# Patient Record
Sex: Female | Born: 1940
Health system: Southern US, Community
[De-identification: ages and names within clinical notes are randomized; demographics above are authoritative.]

## PROBLEM LIST (undated history)

## (undated) DIAGNOSIS — K5730 Diverticulosis of large intestine without perforation or abscess without bleeding: Secondary | ICD-10-CM

## (undated) DIAGNOSIS — Z8719 Personal history of other diseases of the digestive system: Secondary | ICD-10-CM

## (undated) DIAGNOSIS — A419 Sepsis, unspecified organism: Secondary | ICD-10-CM

## (undated) DIAGNOSIS — Z8619 Personal history of other infectious and parasitic diseases: Secondary | ICD-10-CM

## (undated) DIAGNOSIS — E039 Hypothyroidism, unspecified: Secondary | ICD-10-CM

## (undated) DIAGNOSIS — Z87440 Personal history of urinary (tract) infections: Secondary | ICD-10-CM

## (undated) DIAGNOSIS — K219 Gastro-esophageal reflux disease without esophagitis: Secondary | ICD-10-CM

## (undated) DIAGNOSIS — N200 Calculus of kidney: Secondary | ICD-10-CM

## (undated) DIAGNOSIS — E7800 Pure hypercholesterolemia, unspecified: Secondary | ICD-10-CM

## (undated) DIAGNOSIS — K5909 Other constipation: Secondary | ICD-10-CM

## (undated) DIAGNOSIS — Z973 Presence of spectacles and contact lenses: Secondary | ICD-10-CM

## (undated) DIAGNOSIS — K449 Diaphragmatic hernia without obstruction or gangrene: Secondary | ICD-10-CM

## (undated) DIAGNOSIS — E785 Hyperlipidemia, unspecified: Secondary | ICD-10-CM

## (undated) DIAGNOSIS — K5790 Diverticulosis of intestine, part unspecified, without perforation or abscess without bleeding: Secondary | ICD-10-CM

## (undated) DIAGNOSIS — N3020 Other chronic cystitis without hematuria: Secondary | ICD-10-CM

## (undated) DIAGNOSIS — Z972 Presence of dental prosthetic device (complete) (partial): Secondary | ICD-10-CM

## (undated) DIAGNOSIS — Z87442 Personal history of urinary calculi: Secondary | ICD-10-CM

## (undated) HISTORY — PX: BUNIONECTOMY: SHX129

## (undated) HISTORY — PX: ABDOMINAL HYSTERECTOMY: SHX81

## (undated) HISTORY — PX: THYROIDECTOMY: SHX17

---

## 1998-10-05 ENCOUNTER — Other Ambulatory Visit: Admission: RE | Admit: 1998-10-05 | Discharge: 1998-10-05 | Payer: Self-pay | Admitting: *Deleted

## 1998-12-29 ENCOUNTER — Ambulatory Visit (HOSPITAL_BASED_OUTPATIENT_CLINIC_OR_DEPARTMENT_OTHER): Admission: RE | Admit: 1998-12-29 | Discharge: 1998-12-29 | Payer: Self-pay | Admitting: Surgery

## 1999-10-11 ENCOUNTER — Other Ambulatory Visit: Admission: RE | Admit: 1999-10-11 | Discharge: 1999-10-11 | Payer: Self-pay | Admitting: *Deleted

## 1999-12-03 ENCOUNTER — Encounter (INDEPENDENT_AMBULATORY_CARE_PROVIDER_SITE_OTHER): Payer: Self-pay | Admitting: Specialist

## 1999-12-03 ENCOUNTER — Encounter: Payer: Self-pay | Admitting: Surgery

## 1999-12-03 ENCOUNTER — Ambulatory Visit (HOSPITAL_BASED_OUTPATIENT_CLINIC_OR_DEPARTMENT_OTHER): Admission: RE | Admit: 1999-12-03 | Discharge: 1999-12-03 | Payer: Self-pay | Admitting: Surgery

## 2000-04-14 ENCOUNTER — Encounter (INDEPENDENT_AMBULATORY_CARE_PROVIDER_SITE_OTHER): Payer: Self-pay | Admitting: *Deleted

## 2000-04-14 ENCOUNTER — Ambulatory Visit (HOSPITAL_COMMUNITY): Admission: RE | Admit: 2000-04-14 | Discharge: 2000-04-14 | Payer: Self-pay | Admitting: Gastroenterology

## 2000-11-14 ENCOUNTER — Other Ambulatory Visit: Admission: RE | Admit: 2000-11-14 | Discharge: 2000-11-14 | Payer: Self-pay | Admitting: *Deleted

## 2002-09-21 ENCOUNTER — Ambulatory Visit (HOSPITAL_BASED_OUTPATIENT_CLINIC_OR_DEPARTMENT_OTHER): Admission: RE | Admit: 2002-09-21 | Discharge: 2002-09-21 | Payer: Self-pay | Admitting: Urology

## 2002-09-27 ENCOUNTER — Emergency Department (HOSPITAL_COMMUNITY): Admission: EM | Admit: 2002-09-27 | Discharge: 2002-09-27 | Payer: Self-pay | Admitting: Emergency Medicine

## 2003-03-03 ENCOUNTER — Other Ambulatory Visit: Admission: RE | Admit: 2003-03-03 | Discharge: 2003-03-03 | Payer: Self-pay | Admitting: *Deleted

## 2003-08-02 ENCOUNTER — Ambulatory Visit (HOSPITAL_COMMUNITY): Admission: RE | Admit: 2003-08-02 | Discharge: 2003-08-02 | Payer: Self-pay | Admitting: Internal Medicine

## 2003-08-02 ENCOUNTER — Encounter (INDEPENDENT_AMBULATORY_CARE_PROVIDER_SITE_OTHER): Payer: Self-pay | Admitting: Specialist

## 2003-12-16 ENCOUNTER — Encounter (INDEPENDENT_AMBULATORY_CARE_PROVIDER_SITE_OTHER): Payer: Self-pay | Admitting: Specialist

## 2003-12-16 ENCOUNTER — Ambulatory Visit (HOSPITAL_COMMUNITY): Admission: RE | Admit: 2003-12-16 | Discharge: 2003-12-16 | Payer: Self-pay | Admitting: Gastroenterology

## 2010-06-14 ENCOUNTER — Ambulatory Visit (HOSPITAL_BASED_OUTPATIENT_CLINIC_OR_DEPARTMENT_OTHER): Admission: RE | Admit: 2010-06-14 | Discharge: 2010-06-14 | Payer: Self-pay | Admitting: Surgery

## 2010-07-30 ENCOUNTER — Ambulatory Visit (HOSPITAL_COMMUNITY)
Admission: RE | Admit: 2010-07-30 | Discharge: 2010-07-30 | Payer: Self-pay | Source: Home / Self Care | Attending: Urology | Admitting: Urology

## 2010-08-04 ENCOUNTER — Encounter: Payer: Self-pay | Admitting: Internal Medicine

## 2010-09-28 ENCOUNTER — Other Ambulatory Visit: Payer: Self-pay | Admitting: Gastroenterology

## 2010-09-28 DIAGNOSIS — R109 Unspecified abdominal pain: Secondary | ICD-10-CM

## 2010-10-04 ENCOUNTER — Ambulatory Visit
Admission: RE | Admit: 2010-10-04 | Discharge: 2010-10-04 | Disposition: A | Discharge status: Home / Self Care | Payer: Medicare Other | Source: Ambulatory Visit | Attending: Gastroenterology | Admitting: Gastroenterology

## 2010-10-04 DIAGNOSIS — R109 Unspecified abdominal pain: Secondary | ICD-10-CM

## 2010-10-04 MED ORDER — IOHEXOL 300 MG/ML  SOLN
125.0000 mL | Freq: Once | INTRAMUSCULAR | Status: AC | PRN
  Administered 2010-10-04: 125 mL via INTRAVENOUS

## 2010-10-11 ENCOUNTER — Other Ambulatory Visit: Payer: Self-pay | Admitting: Gastroenterology

## 2010-11-30 NOTE — Op Note (Signed)
[place], [facility], [facility]  Patient:    Joanna Johnston, Joanna Johnston                       MRN: [id number] Proc. Date: 12/03/99 Attending:  Sandria Bales. Ezzard Standing, M.D. Dictator:   Sandria Bales. Ezzard Standing, M.D. CC:         Jamesetta Geralds, M.D.                           Operative Report  DATE OF BIRTH:  11/03/1940  PREOPERATIVE DIAGNOSIS:  Microcalcification/mass with abnormal mammogram at 2 oclock position of left breast.  POSTOPERATIVE DIAGNOSIS:  Microcalcification/mass with abnormal mammogram at 2 oclock position of left breast.  PROCEDURE:  Needle excisional breast biopsy of left breast.  SURGEON:  Dr. Ezzard Standing.  ANESTHESIA:  MAC anesthesia with approximately 18 cc of 1% Xylocaine with epinephrine.  COMPLICATIONS:  None.  INDICATIONS FOR PROCEDURE:  Joanna Johnston is a 70 year old white female who comes with an abnormal mammogram showing some microcalcifications in the upper outer quadrant of the left breast with a questionable mass effect.  Changes are somewhat subtle, but appear to be real, and now comes for excisional breast biopsy.  DESCRIPTION OF PROCEDURE:  The patient is placed in a supine position.  Given a MAC anesthesia.  The left breast was prepped with Betadine solution and sterilely draped.  A wire was protruding from the left breast in the 2 oclock position about 4 cm from the edge of the areola, and was directed directly posterior.  I infiltrated the skin with 1% Xylocaine with epinephrine, then excised the wire with a surrounding block of breast tissue approximately 3 x 4 cm in size.  I went down to the lateral edge of the pectoralis major muscle, even may of taken a lymph node with the specimen.  Grossly, there was no abnormal mass within this specimen.  I sent it for specimen mammography which confirmed that the microcalcifications were excised along with a probable mass.  This was sent to pathology for a permanent analysis.  The wound was irrigated,  subcutaneous tissues closed with a 3-0 Vicryl suture. Skin closed with a 5-0 Vicryl suture.  Painted with ______, benzoin, and Steri-Strips, and sterilely dressed.  The patient tolerated the procedure well, was transported to the recovery room in good condition.  Sponge and needle count were correct at the end of the case. DD:  12/03/99 TD:  12/03/99 Job: [id number]

## 2010-11-30 NOTE — Op Note (Signed)
NAME:  Joanna Johnston, Joanna Johnston                        ACCOUNT NO.:  [id number]   MEDICAL RECORD NO.:  [id number]                   PATIENT TYPE:  AMB   LOCATION:  ENDO                                 FACILITY:  [facility]   PHYSICIAN:  Bernette Redbird, M.D.                DATE OF BIRTH:  20-Aug-1940   DATE OF PROCEDURE:  12/16/2003  DATE OF DISCHARGE:                                 OPERATIVE REPORT   PROCEDURE:  Colonoscopy with biopsy.   INDICATION:  Joanna Johnston is a 70 year old female with a family history of  colon cancer in her father at age 54, and a personal history of some polyps  having been removed in the past by another physician, cauterized to  destruction, so histologic character is unknown.  She underwent colonoscopy  by me about 4 years ago which was negative for any adenomas.   FINDINGS:  Solitary diminutive rectal polyp.   DESCRIPTION OF PROCEDURE:  The nature, purpose, and risks of the procedure  were familiar to the patient from prior examination.  She provided written  consent.  Sedation was fentanyl 50 mcg and Versed 8 mg IV without  arrhythmias or desaturation.  The Olympus adjustable tension pediatric video  colonoscope was easily advanced to the cecum which was identified by  visualization of the appendiceal orifice.  Pullback was then performed.  The  quality of the prep was excellent, and it is felt that all areas were well  seen.   There was some mild to moderate left-sided diverticulosis.  There was a 3 mm  sessile polyp in the rectum removed by a two cold biopsies.  No other polyps  were seen, and there was no evidence of cancer, colitis, or vascular  malformations.  Retroflexion in the rectum and reinspection in the rectum  was otherwise unremarkable.   The patient tolerated the procedure well, and there were no apparent  complications.   IMPRESSION:  1. Solitary diminutive rectal polyp removed as described above (211.4).  2. Left-sided diverticulosis.   PLAN:  Await pathology results with anticipated colonoscopic follow up in 5  years in view of the family history of colon cancer and the previous history  of some sort of polyp removed from this patient (indeterminate histology).                                               Bernette Redbird, M.D.    RB/MEDQ  D:  12/16/2003  T:  12/16/2003  Job:  [id number]   cc:   Geoffry Paradise, M.D.  [address]  [place]  [place] [address]  Fax: [phone]

## 2010-11-30 NOTE — Op Note (Signed)
NAME:  Joanna Johnston, PALO                        ACCOUNT NO.:  [id number]   MEDICAL RECORD NO.:  [id number]                   PATIENT TYPE:  AMB   LOCATION:  [facility]                                 FACILITY:  [facility]   PHYSICIAN:  Mark C. Vernie Ammons, M.D.               DATE OF BIRTH:  [birth year]   DATE OF PROCEDURE:  09/21/2002  DATE OF DISCHARGE:                                 OPERATIVE REPORT   PREOPERATIVE DIAGNOSES:  Right flank pain and hematuria.   POSTOPERATIVE DIAGNOSES:  Right flank pain and hematuria.   PROCEDURE:  Cystoscopy, right retrograde pyelogram with interpretation,  right ureteroscopy and double J stent placement.   SURGEON:  Mark C. Vernie Ammons, M.D.   ANESTHESIA:  General.   ESTIMATED BLOOD LOSS:  Less than 5 mL.   SPECIMENS:  None.   DRAINS:  4.7 French, 24 cm double J stent in the left ureter with string.   COMPLICATIONS:  None.   INDICATIONS FOR PROCEDURE:  The patient is a 70 year old white female who  was originally seen on February 25 with right sided flank pain, microscopic  hematuria and was found on KUB to have multiple phleboliths. She had classic  ureteral colic. She underwent renal ultrasound and KUB on September 15, 2002 that  showed no hydronephrosis or stones were seen. Multiple calcifications were  seen on KUB in the pelvis. She wanted to forgo contrast study and due to the  fact that this was likely a stone she was brought to the OR today for  retrograde and ureteroscopy. She understands the fact that a definite stone  has not been demonstrated at this time.   DESCRIPTION OF PROCEDURE:  After informed consent, the patient brought the  major OR, placed on the table administered general anesthesia then moved to  the dorsal lithotomy position. The genitalia were sterilely prepped and  draped. A 21 French cystoscope was introduced to the bladder, the bladder  was fully inspected and noted to be free of tumor, stones or inflammatory  lesions. The right  ureteral orifice was identified, it was noted to be very  small. I could not get a 6 [place] open ended ureteral catheter into the  orifice. I therefore chose an 8 French cone tip catheter which I was able to  get in the orifice and the action of the cone tip caused some dilation. I  was able to get the stent into the distal ureter and performed a right  retrograde pyelogram under direct fluoroscopic control. Revealed normal  appearing ureter with no filling defects throughout its course. It was a  little full and tapered right at the UPJ. I saw no filling defects,  hydronephrosis, masses or mass effect in the area of the kidney. The  collecting system otherwise appeared normal.   I removed the cone tip catheter and reinserted a 6 [place] open ended which I  was able to get into  the orifice and passed a 0.038 inch floppy tip  guidewire up the ureter under fluoroscopic control. Leaving the guidewire in  place and draining the bladder, I removed the cystoscope and ureteral  catheter. Attempt to pass the 6 French rigid ureteroscope next to the  guidewire was unsuccessful. I therefore back loaded the cystoscope over the  guidewire and passed the 14 French 4 cm length ureteral dilating balloon  over the guidewire and placed this through the UVJ region and began to  inflate. As I did this, I noted some waisting of the dilating balloon  indicating some narrowing, possible mild stricture. This dilated easily. I  then deflated the balloon and was then able to pass the ureteroscope next to  the guidewire and I was able to pass this all the way up the left ureter  without the finding of any stone, tumor or other abnormality. I was able to  pass the scope all the way to the renal pelvis with no abnormality being  noted. I visualized the ureter as I removed the scope and again found no  abnormality. I left the guidewire in place, back loaded the cystoscope and  passed the double J stent over the  guidewire into the pelvis with good curl  being noted in the renal pelvis and bladder. I then removed the guidewire  and the cystoscope after draining the bladder. The string was left affixed  to the stent and taped to the inner thigh and the B&O suppository inserted  per rectum. The patient was awakened and taken to the recovery room in  stable satisfactory condition. She tolerated the procedure well with no  intraoperative complications. She will be given a prescription for 36  Pyridium plus for bladder irritation. She already has Toradol and Mepergan  for pain.  Will followup in my office in approximately five days.                                               Mark C. Vernie Ammons, M.D.    MCO/MEDQ  D:  09/21/2002  T:  09/21/2002  Job:  [id number]

## 2010-11-30 NOTE — Op Note (Signed)
[place]. [facility]  Patient:    Joanna Johnston, Joanna Johnston                     MRN: [id number] Proc. Date: 04/14/00 Adm. Date:  [id number] Attending:  Rich Brave CC:         Jamesetta Geralds, M.D.   Operative Report  PROCEDURE:  Colonoscopy with polypectomy.  INDICATIONS:  Follow up of several small polyps of indeterminate histology removed several years ago by another gastroenterologist.  FINDINGS:  Two small polyps removed.  Scattered pancolonic diverticulosis.  DESCRIPTION OF PROCEDURE:  The nature, purpose, and risks of the procedure have been reviewed with the patient who provided written consent.  Sedation was fentanyl 100 mcg and Versed 6 mg IV without arrhythmias or desaturation. The Olympus pediatric adjustable tension video colonoscope, PCF160L was advanced quite easily around the colon to the cecum as identified by clear visualization of typical cecal anatomy.  This included the ileocecal valve. A careful inspection of the cecum was performed and pullback was then performed. The quality of the prep was excellent and it is felt that all areas were well seen.  There were two polyps identified during this examination.  The first was a sessile 3 x 4 mm hyperplastic-appearing polyp in the cecum, removed by two or three cold biopsies and the other was a semipedunculated polyp at the rectosigmoid junction at about 20 cm, removed by snare technique with complete hemostasis and no evidence of excessive cautery.  No large polyps, cancer, colitis, or vascular malformations were seen.  There was a cecal diverticulum as well as some scattered diverticulosis elsewhere in the colon, but the amount of diverticulosis was rather modest.  Retroflexion in the rectum showed some internal hemorrhoids, also seen during pullout through the anal canal.  The patient tolerated this procedure well and there were no apparent complications.  IMPRESSION: 1. Two small  polyps removed as described above. 2. Diverticulosis, mild.  PLAN:  Await pathology on the polyps with anticipated colonoscopic follow-up in three to five years. DD:  04/14/00 TD:  04/14/00 Job: [id number]

## 2011-04-01 ENCOUNTER — Inpatient Hospital Stay (HOSPITAL_COMMUNITY)
Admission: EM | Admit: 2011-04-01 | Discharge: 2011-04-07 | DRG: 872 | Disposition: A | Discharge status: Home / Self Care | Payer: Medicare Other | Source: Ambulatory Visit | Attending: Internal Medicine | Admitting: Internal Medicine

## 2011-04-01 ENCOUNTER — Emergency Department (HOSPITAL_COMMUNITY): Payer: Medicare Other

## 2011-04-01 DIAGNOSIS — A4151 Sepsis due to Escherichia coli [E. coli]: Principal | ICD-10-CM | POA: Diagnosis present

## 2011-04-01 DIAGNOSIS — I959 Hypotension, unspecified: Secondary | ICD-10-CM | POA: Diagnosis present

## 2011-04-01 DIAGNOSIS — N200 Calculus of kidney: Secondary | ICD-10-CM | POA: Diagnosis present

## 2011-04-01 DIAGNOSIS — Z7982 Long term (current) use of aspirin: Secondary | ICD-10-CM

## 2011-04-01 DIAGNOSIS — R319 Hematuria, unspecified: Secondary | ICD-10-CM | POA: Diagnosis present

## 2011-04-01 DIAGNOSIS — Z23 Encounter for immunization: Secondary | ICD-10-CM

## 2011-04-01 DIAGNOSIS — I10 Essential (primary) hypertension: Secondary | ICD-10-CM | POA: Diagnosis present

## 2011-04-01 DIAGNOSIS — E785 Hyperlipidemia, unspecified: Secondary | ICD-10-CM | POA: Diagnosis present

## 2011-04-01 DIAGNOSIS — Z79899 Other long term (current) drug therapy: Secondary | ICD-10-CM

## 2011-04-01 DIAGNOSIS — N390 Urinary tract infection, site not specified: Secondary | ICD-10-CM | POA: Diagnosis present

## 2011-04-01 DIAGNOSIS — E039 Hypothyroidism, unspecified: Secondary | ICD-10-CM | POA: Diagnosis present

## 2011-04-01 DIAGNOSIS — R112 Nausea with vomiting, unspecified: Secondary | ICD-10-CM | POA: Diagnosis present

## 2011-04-01 DIAGNOSIS — E538 Deficiency of other specified B group vitamins: Secondary | ICD-10-CM | POA: Diagnosis present

## 2011-04-01 DIAGNOSIS — A419 Sepsis, unspecified organism: Secondary | ICD-10-CM | POA: Diagnosis present

## 2011-04-01 DIAGNOSIS — I808 Phlebitis and thrombophlebitis of other sites: Secondary | ICD-10-CM | POA: Diagnosis not present

## 2011-04-01 LAB — CBC
HCT: 36 % (ref 36.0–46.0)
Hemoglobin: 12.4 g/dL (ref 12.0–15.0)
WBC: 13.5 10*3/uL — ABNORMAL HIGH (ref 4.0–10.5)

## 2011-04-01 LAB — DIFFERENTIAL
Basophils Absolute: 0 10*3/uL (ref 0.0–0.1)
Eosinophils Absolute: 0 10*3/uL (ref 0.0–0.7)
Eosinophils Relative: 0 % (ref 0–5)
Lymphocytes Relative: 5 % — ABNORMAL LOW (ref 12–46)
Lymphs Abs: 0.7 10*3/uL (ref 0.7–4.0)
Neutrophils Relative %: 87 % — ABNORMAL HIGH (ref 43–77)

## 2011-04-01 LAB — BASIC METABOLIC PANEL
BUN: 29 mg/dL — ABNORMAL HIGH (ref 6–23)
CO2: 27 mEq/L (ref 19–32)
Chloride: 105 mEq/L (ref 96–112)
Glucose, Bld: 136 mg/dL — ABNORMAL HIGH (ref 70–99)
Potassium: 3.1 mEq/L — ABNORMAL LOW (ref 3.5–5.1)

## 2011-04-01 LAB — URINALYSIS, ROUTINE W REFLEX MICROSCOPIC
Bilirubin Urine: NEGATIVE
Specific Gravity, Urine: 1.015 (ref 1.005–1.030)
pH: 5.5 (ref 5.0–8.0)

## 2011-04-01 LAB — URINE MICROSCOPIC-ADD ON

## 2011-04-02 ENCOUNTER — Observation Stay (HOSPITAL_COMMUNITY): Payer: Medicare Other

## 2011-04-02 LAB — CBC
HCT: 30.8 % — ABNORMAL LOW (ref 36.0–46.0)
MCH: 28.9 pg (ref 26.0–34.0)
MCHC: 34.1 g/dL (ref 30.0–36.0)
RDW: 13.7 % (ref 11.5–15.5)

## 2011-04-02 LAB — BASIC METABOLIC PANEL
BUN: 9 mg/dL (ref 6–23)
Calcium: 7.9 mg/dL — ABNORMAL LOW (ref 8.4–10.5)
Creatinine, Ser: 1.09 mg/dL (ref 0.50–1.10)
GFR calc Af Amer: >60 mL/min (ref >60)
GFR calc non Af Amer: 50 mL/min — ABNORMAL LOW (ref >60)
Potassium: 3.1 mEq/L — ABNORMAL LOW (ref 3.5–5.1)

## 2011-04-02 LAB — LACTIC ACID, PLASMA: Lactic Acid, Venous: 1 mmol/L (ref 0.5–2.2)

## 2011-04-02 LAB — B. BURGDORFI ANTIBODIES: B burgdorferi Ab IgG+IgM: 0.12 {ISR}

## 2011-04-02 LAB — MRSA PCR SCREENING: MRSA by PCR: NEGATIVE

## 2011-04-03 ENCOUNTER — Inpatient Hospital Stay (HOSPITAL_COMMUNITY): Payer: Medicare Other

## 2011-04-03 LAB — BASIC METABOLIC PANEL
BUN: 7 mg/dL (ref 6–23)
Calcium: 7.7 mg/dL — ABNORMAL LOW (ref 8.4–10.5)
Creatinine, Ser: 0.96 mg/dL (ref 0.50–1.10)
GFR calc Af Amer: >60 mL/min (ref >60)
GFR calc non Af Amer: 58 mL/min — ABNORMAL LOW (ref >60)

## 2011-04-03 LAB — CBC
HCT: 30.7 % — ABNORMAL LOW (ref 36.0–46.0)
MCH: 28 pg (ref 26.0–34.0)
MCHC: 32.9 g/dL (ref 30.0–36.0)
MCV: 85 fL (ref 78.0–100.0)
Platelets: 151 10*3/uL (ref 150–400)
RDW: 13.5 % (ref 11.5–15.5)
WBC: 9.4 10*3/uL (ref 4.0–10.5)

## 2011-04-04 LAB — COMPREHENSIVE METABOLIC PANEL
ALT: 26 U/L (ref 0–35)
Albumin: 2.7 g/dL — ABNORMAL LOW (ref 3.5–5.2)
Alkaline Phosphatase: 62 U/L (ref 39–117)
Calcium: 8.2 mg/dL — ABNORMAL LOW (ref 8.4–10.5)
GFR calc Af Amer: >60 mL/min (ref >60)
Glucose, Bld: 89 mg/dL (ref 70–99)
Potassium: 3.6 mEq/L (ref 3.5–5.1)
Sodium: 143 mEq/L (ref 135–145)
Total Protein: 5.9 g/dL — ABNORMAL LOW (ref 6.0–8.3)

## 2011-04-04 LAB — URINE CULTURE
Colony Count: >=100000
Culture  Setup Time: 201209171853

## 2011-04-05 ENCOUNTER — Inpatient Hospital Stay (HOSPITAL_COMMUNITY): Payer: Medicare Other

## 2011-04-06 LAB — COMPREHENSIVE METABOLIC PANEL
Albumin: 2.8 g/dL — ABNORMAL LOW (ref 3.5–5.2)
Alkaline Phosphatase: 76 U/L (ref 39–117)
BUN: 5 mg/dL — ABNORMAL LOW (ref 6–23)
Chloride: 106 mEq/L (ref 96–112)
Creatinine, Ser: 0.8 mg/dL (ref 0.50–1.10)
GFR calc Af Amer: >60 mL/min (ref >60)
GFR calc non Af Amer: >60 mL/min (ref >60)
Glucose, Bld: 109 mg/dL — ABNORMAL HIGH (ref 70–99)
Potassium: 3.4 mEq/L — ABNORMAL LOW (ref 3.5–5.1)
Total Bilirubin: 0.3 mg/dL (ref 0.3–1.2)

## 2011-04-06 LAB — CBC
HCT: 30.7 % — ABNORMAL LOW (ref 36.0–46.0)
Hemoglobin: 10.6 g/dL — ABNORMAL LOW (ref 12.0–15.0)
MCV: 83.2 fL (ref 78.0–100.0)
RDW: 12.8 % (ref 11.5–15.5)
WBC: 6.4 10*3/uL (ref 4.0–10.5)

## 2011-04-06 LAB — URINE CULTURE
Colony Count: NO GROWTH
Culture: NO GROWTH
Special Requests: NEGATIVE

## 2011-04-07 NOTE — H&P (Signed)
NAMEDENALY, GATLING NO.:  [id number]  MEDICAL RECORD NO.:  [id number]  LOCATION:                                 FACILITY:  PHYSICIAN:  Geoffry Paradise, M.D.  DATE OF BIRTH:  02-13-1941  DATE OF ADMISSION:  04/01/2011 DATE OF DISCHARGE:                             HISTORY & PHYSICAL   CHIEF COMPLAINT:  Fever and hypotension.  HISTORY OF PRESENT ILLNESS:  Joanna Johnston is a 70 year old well known to myself with hypothyroidism, hypertension, hyperlipidemia presenting at this time with hypotension and pyuria.  She relates that she developed the onset of some low-grade fevers and general malaise on Sunday. Noteworthy, she had been to the urologist sometime last week with some hematuria.  Sometime yesterday, she developed hard rigors and chills, followed by some nausea and vomiting and presented to the emergency room.  She has also developed some dysuria and some generalized upper back/flank pain.  She has had some dull headaches but no further vomiting or abdominal symptoms.  She has had no chest pain, cough, or congestion.  She has had no rash or joint symptoms.  In the emergency room, she had received 4 liters of normal saline and continued normal saline at 250 mL/hour with systolic pressures that were marginally in the upper 80s to 95 range.  I was called for admission and when instructed that an ICU admission was closed, Critical Care was consulted.  They did see the patient and felt as though since no lines or pressors were on board or "indicated," my only options were admitting her to step-down where if I was uncomfortable with that I could turn her over to the hospitalist.  I have come in to assess her myself and blood pressures have been running as documented on the monitor 73/45, 70/43, 81/38, 86/27, 88/45 with now 6 liters in and continued IV fluids at 250 mL/hour.  The patient is mentating and does feel better and has had a reasonable urine output.  She  does continue to have significant dysuria but no further abdominal symptoms, nausea, or vomiting.  She is admitted with suspect sepsis, urinary source for further management.  PAST MEDICAL HISTORY:  The patient is intolerant to CODEINE.  MEDICATIONS: 1. Vytorin 10/10 one daily. 2. Vitamin B12 - 1000 mcg daily. 3. Synthroid 75 mcg p.o. daily. 4. Calcium and vitamin D daily. 5. Avalide 150/12.5 daily. 6. Aspirin 81 mg daily.  MEDICAL ILLNESSES: 1. Essential hypertension. 2. Hyperlipidemia. 3. Hypothyroidism. 4. Known renal calculi.  FAMILY HISTORY:  Noncontributory.  SOCIAL HISTORY:  The patient is married with children, is currently working in a medical office administratively.  She does not smoke or drink.  PHYSICAL EXAMINATION:  VITAL SIGNS:  Temperature currently 98.2, T-max since arrival 102.1, blood pressure on arrival was 130/50, most recently systolics have ranged 70-88/43, heart rate 80-100, O2 saturation 97%. GENERAL:  The patient is awake, alert. HEENT:  Normocephalic, atraumatic individual.  Anicteric.  Oropharynx benign. SKIN:  No lesions.  Warm.  No mottling.  Oropharynx benign. NECK:  No JVD or bruits. LUNGS:  Clear to auscultation and percussion. CARDIOVASCULAR:  Regular rate and rhythm.  No murmur, gallop, rub, or heave. ABDOMEN:  Soft, nontender.  No hepatosplenomegaly. BACK:  Questionable right CVA tenderness.  No spinal tenderness. EXTREMITIES:  No mottling.  Warm.  Distal pulses intact.  Trace edema in hands and feet.  DATA:  Chest x-ray, no acute disease. CBC:  Hemoglobin 12.4, hematocrit 36, white blood count 13.5, platelet count 197,000.  Chemistry:  Sodium 140, potassium 3.1, chloride 105, CO2 is 27, glucose 136, BUN 29, creatinine 1.4, calcium 8.8.  Urinalysis remarkable for moderate leukocyte esterase, positive nitrite, 21-50 white cells, 7-10 red cells.  Lactic acid 1.  ASSESSMENT: 1. Sepsis, likely urinary source with prior urologic  history.  We will     obtain a stat renal ultrasound to rule out an obstructive     uropathy/hydro and/or abscess.  The patient has been pancultured on     Cipro and Rocephin.  Fluid resuscitation 6 liters and continues to     be hypotensive, we will start dopamine. 2. Essential hypertension. 3. Hyperlipidemia. 4. Hypothyroidism.  PLAN:  For further, see orders.  Please note, dictating attending, myself, felt as though ICU was appropriate but Critical Care Medicine has refused to allow this admission, so the patient will be admitted to step-down as I have no options, so turning this over to the hospitalist as was outlined to me.          ______________________________ Geoffry Paradise, M.D.     RA/MEDQ  D:  04/02/2011  T:  04/02/2011  Job:  [id number]  Electronically Signed by Geoffry Paradise M.D. on 04/07/2011 07:57:32 PM

## 2011-04-07 NOTE — Discharge Summary (Signed)
Joanna Johnston, Joanna Johnston NO.:  [id number]  MEDICAL RECORD NO.:  [id number]  LOCATION:  4505                         FACILITY:  [facility]  PHYSICIAN:  Gwen Pounds, MD       DATE OF BIRTH:  1941/04/19  DATE OF ADMISSION:  04/01/2011 DATE OF DISCHARGE:  04/07/2011                              DISCHARGE SUMMARY   PRIMARY CARE PROVIDER:  Geoffry Paradise, M.D.  PRIMARY UROLOGIST:  Mark C. Vernie Ammons, M.D.  DISCHARGE DIAGNOSES: 1. Escherichia coli urosepsis with severe hypotension. 2. Critically ill, requiring pressors to maintain blood pressures. 3. Known nephrolithiasis and hematuria, follows with Dr. Vernie Ammons in     Urology. 4. Hyperlipidemia. 5. B12 deficiency. 6. Hypothyroidism. 7. Hypertension. 8. Left antecubital superficial thrombophlebitis.  DISCHARGE PROCEDURES: 1. Status post ICU and pressor use. 2. Medicine management. 3. Renal ultrasound done on April 02, 2011, showing a 4-mm right     lower pole calculus, but no hydronephrosis. 4. KUB on April 03, 2011, showing nonspecific bowel gas pattern. 5. Chest x-ray on April 05, 2011, showing retrocardiac air space     opacification raising concern for pneumonia with associated small     left pleural effusion, mild right basilar atelectasis noted.  No     clinical pneumonia noted.  DISCHARGE MEDICATIONS: 1. Tylenol 650 mg every 4 hours as needed. 2. Ceftriaxone 1 g IM daily.  Already received 10:00 a.m. dose today,     will get the 10:00 a.m. dose tomorrow and then be transition to     Cipro for 500 b.i.d. for 7 further day course. 3. Tramadol 50 mg p.o. q.6 h. p.r.n. pain. 4. Cipro 500 mg p.o. b.i.d. for 7 days starting on April 09, 2011. 5. Aspirin 81 two tablets p.o. daily. 6. Calcium plus D 1 tablet p.o. daily. 7. Synthroid 75 mcg every day. 8. B12 1000 mcg as directed. 9. Vytorin 10/10 p.o. daily. 10.Avalide 150/12.5 on hold until Dr. Jacky Kindle restarts it or if blood     pressure  consistently goes over 140/85.  HISTORY OF PRESENT ILLNESS:  Briefly, Joanna Johnston is a 70 year old female with known nephrolithiasis and history of urinary related issues had her appointment with Dr. Vernie Ammons about a week and half or 2 weeks ago, had a negative culture.  She developed low-grade fevers and chills on the Sunday prior to admission.  She actually woke up the next day and was feeling better, went to work and got sicker with fevers, chills, rigors, nausea, vomiting, dysuria, back and flank pain, and dull headaches.  In the emergency room, she was noted to be hypotensive and sick.  She received 4 L of normal saline fluid with rapid fluid infusion and she was eventually admitted to the ICU.  Because a low blood pressures, pressors were started and were needed.  HOSPITAL COURSE:  Joanna Johnston was admitted through the Emergency Department with a white count of 13.5 and urinalysis compatible with urinary tract infection and creatinine went up to 1.4 and she was overtly ill and in urosepsis.  Workup included the renal ultrasound, which was stated above.  There was no obstructive uropathy, hydronephrosis, or abscess noted.  She  was pancultured and initially started on Cipro and Rocephin.  She continued to get IV fluids and was started on dopamine for pressors.  Lots of her outpatient medications were held.  Over the next several days, the antibiotics worked and she improved nicely, she started defervescing.  The urine culture came back for E-coli that was resistant to ampicillin and Bactrim, but sensitive to all other medications that were evaluated.  Blood cultures were negative.  Pressors were eventually weaned and when her blood pressures were appropriate, she was transferred to the floor.  Unfortunately during the hospital stay, she developed a left antecubital thrombophlebitis.  This was treated with DVT prophylaxis Lovenox and warm compresses and improved nicely,  but it still looks like it is going to take a few weeks to fully resolve.  Because of the superficial thrombophlebitis just mentioned, she had lots of issues.  She lost her IV.  The good news is she did not need IV fluids anymore, multiple attempts were obtained of getting a new IV and thoughts were about putting a PICC line in.  Eventually, we decided not to put the PICC line in and not to pursue further IV.  We have switched her over to IM Rocephin because to me it was felt that it would be just as effective, so she got a dose of IM Rocephin on Saturday April 06, 2011, and Sunday April 07, 2011.  Dr. Jacky Kindle had instructed that she was going to do a full days of IV Rocephin, therefore she will be discharged home today in stable condition and will follow up in our office tomorrow for her seventh of 7 days of Rocephin and then thereafter, she will finish up 7 more days of Cipro to finish out a 14- day course.  She did have temperatures up to 100.3 on April 05, 2011 and April 06, 2011 and 99.6 on April 06, 2011 and April 07, 2011.  She is markedly better at this current time.  She is feeling markedly better.  No nausea, no vomiting.  Eating, walking, and feeling much more like herself and on April 07, 2011, she is ready for discharge.  Today on rounds, any repeat cultures were all reviewed and are negative.  Chest x-ray was negative and is not felt to show pneumonia.  Again, she has had a Rocephin today and she is looking and feeling back to her baseline and will be discharged today.  Temperature 98.4 up to 99.5, heart rate 59-64, respiratory rate 20, blood pressure 118-131 over 62-64, and satting 94% on room air.  Her physical exam is at baseline.  Her right arm is bruised.  Her left arm shows left antecubital fossa which is superficial thrombophlebitis which seems about 3 or 4 cm in length, but markedly less swollen and certainly not hot, no cellulitis and no  issues to touch, certainly no pain.  There are no new labs reviewed today and her last CMET was yesterday and showed a sodium of 144, potassium 3.4, the rest of her labs were fine with a BUN of 5, creatinine 0.8, and glucose was 109 and liver tests were within normal limits except for a low albumin.  CBC yesterday showed a white count of 6.4, hemoglobin 10.6, and platelet count 200.  Therefore the current plan is, she will go home today, get 1 shot of Rocephin in the office, follow-up with Dr. Vernie Ammons on Wednesday, take Cipro for 1 week, and find out from Dr. Vernie Ammons what he wants to  do about that stone that has not moved.  Discussion had been already had regarding possible lithotripsy.  At this point in time, she can go back on her lipid medications.  She can continue on her Synthroid.  She no longer needs Lovenox or outpatient treatment of heparin.  She can continue warm compresses to the left arm and she will restart her blood pressure medicines when her blood pressure goes up or after following up with Dr. Jacky Kindle.     Gwen Pounds, MD     JMR/MEDQ  D:  04/07/2011  T:  04/07/2011  Job:  [id number]  cc:   Veverly Fells. Vernie Ammons, M.D. Geoffry Paradise, M.D.  Electronically Signed by Creola Corn MD on 04/07/2011 11:29:28 PM

## 2011-04-08 LAB — CULTURE, BLOOD (ROUTINE X 2)
Culture  Setup Time: 201209182215
Culture: NO GROWTH

## 2011-04-12 LAB — CULTURE, BLOOD (ROUTINE X 2): Culture  Setup Time: 201209220529

## 2011-05-29 ENCOUNTER — Other Ambulatory Visit: Payer: Self-pay | Admitting: Internal Medicine

## 2011-05-29 DIAGNOSIS — E039 Hypothyroidism, unspecified: Secondary | ICD-10-CM

## 2011-06-12 ENCOUNTER — Ambulatory Visit
Admission: RE | Admit: 2011-06-12 | Discharge: 2011-06-12 | Disposition: A | Discharge status: Home / Self Care | Payer: Medicare Other | Source: Ambulatory Visit | Attending: Internal Medicine | Admitting: Internal Medicine

## 2011-06-12 DIAGNOSIS — E039 Hypothyroidism, unspecified: Secondary | ICD-10-CM

## 2011-06-13 ENCOUNTER — Other Ambulatory Visit: Payer: Medicare Other

## 2011-06-20 ENCOUNTER — Other Ambulatory Visit: Payer: Self-pay | Admitting: Internal Medicine

## 2011-06-20 DIAGNOSIS — E041 Nontoxic single thyroid nodule: Secondary | ICD-10-CM

## 2011-06-25 ENCOUNTER — Ambulatory Visit
Admission: RE | Admit: 2011-06-25 | Discharge: 2011-06-25 | Disposition: A | Discharge status: Home / Self Care | Payer: Medicare Other | Source: Ambulatory Visit | Attending: Internal Medicine | Admitting: Internal Medicine

## 2011-06-25 ENCOUNTER — Other Ambulatory Visit (HOSPITAL_COMMUNITY)
Admission: RE | Admit: 2011-06-25 | Discharge: 2011-06-25 | Disposition: A | Discharge status: Home / Self Care | Payer: Medicare Other | Source: Ambulatory Visit | Attending: Interventional Radiology | Admitting: Interventional Radiology

## 2011-06-25 DIAGNOSIS — E041 Nontoxic single thyroid nodule: Secondary | ICD-10-CM

## 2011-06-25 DIAGNOSIS — E049 Nontoxic goiter, unspecified: Secondary | ICD-10-CM | POA: Insufficient documentation

## 2011-09-25 DIAGNOSIS — R3129 Other microscopic hematuria: Secondary | ICD-10-CM | POA: Diagnosis not present

## 2011-09-25 DIAGNOSIS — R82998 Other abnormal findings in urine: Secondary | ICD-10-CM | POA: Diagnosis not present

## 2011-10-08 DIAGNOSIS — R3129 Other microscopic hematuria: Secondary | ICD-10-CM | POA: Diagnosis not present

## 2011-12-03 DIAGNOSIS — N63 Unspecified lump in unspecified breast: Secondary | ICD-10-CM | POA: Diagnosis not present

## 2011-12-03 DIAGNOSIS — R350 Frequency of micturition: Secondary | ICD-10-CM | POA: Diagnosis not present

## 2011-12-19 DIAGNOSIS — E782 Mixed hyperlipidemia: Secondary | ICD-10-CM | POA: Diagnosis not present

## 2011-12-19 DIAGNOSIS — R82998 Other abnormal findings in urine: Secondary | ICD-10-CM | POA: Diagnosis not present

## 2011-12-19 DIAGNOSIS — R3129 Other microscopic hematuria: Secondary | ICD-10-CM | POA: Diagnosis not present

## 2011-12-19 DIAGNOSIS — I10 Essential (primary) hypertension: Secondary | ICD-10-CM | POA: Diagnosis not present

## 2011-12-19 DIAGNOSIS — R350 Frequency of micturition: Secondary | ICD-10-CM | POA: Diagnosis not present

## 2011-12-31 DIAGNOSIS — H9209 Otalgia, unspecified ear: Secondary | ICD-10-CM | POA: Diagnosis not present

## 2011-12-31 DIAGNOSIS — H60509 Unspecified acute noninfective otitis externa, unspecified ear: Secondary | ICD-10-CM | POA: Diagnosis not present

## 2011-12-31 DIAGNOSIS — H6120 Impacted cerumen, unspecified ear: Secondary | ICD-10-CM | POA: Diagnosis not present

## 2012-01-02 DIAGNOSIS — N3020 Other chronic cystitis without hematuria: Secondary | ICD-10-CM | POA: Diagnosis not present

## 2012-01-02 DIAGNOSIS — N200 Calculus of kidney: Secondary | ICD-10-CM | POA: Diagnosis not present

## 2012-01-13 DIAGNOSIS — N3020 Other chronic cystitis without hematuria: Secondary | ICD-10-CM | POA: Diagnosis not present

## 2012-02-04 DIAGNOSIS — N3020 Other chronic cystitis without hematuria: Secondary | ICD-10-CM | POA: Diagnosis not present

## 2012-02-18 DIAGNOSIS — G518 Other disorders of facial nerve: Secondary | ICD-10-CM | POA: Diagnosis not present

## 2012-04-08 DIAGNOSIS — N3020 Other chronic cystitis without hematuria: Secondary | ICD-10-CM | POA: Diagnosis not present

## 2012-04-08 DIAGNOSIS — R350 Frequency of micturition: Secondary | ICD-10-CM | POA: Diagnosis not present

## 2012-04-10 DIAGNOSIS — Z23 Encounter for immunization: Secondary | ICD-10-CM | POA: Diagnosis not present

## 2012-05-27 DIAGNOSIS — D235 Other benign neoplasm of skin of trunk: Secondary | ICD-10-CM | POA: Diagnosis not present

## 2012-05-27 DIAGNOSIS — L820 Inflamed seborrheic keratosis: Secondary | ICD-10-CM | POA: Diagnosis not present

## 2012-05-27 DIAGNOSIS — D485 Neoplasm of uncertain behavior of skin: Secondary | ICD-10-CM | POA: Diagnosis not present

## 2012-05-27 DIAGNOSIS — L570 Actinic keratosis: Secondary | ICD-10-CM | POA: Diagnosis not present

## 2012-06-30 DIAGNOSIS — Z1231 Encounter for screening mammogram for malignant neoplasm of breast: Secondary | ICD-10-CM | POA: Diagnosis not present

## 2012-09-08 DIAGNOSIS — L570 Actinic keratosis: Secondary | ICD-10-CM | POA: Diagnosis not present

## 2012-10-26 DIAGNOSIS — Z6830 Body mass index (BMI) 30.0-30.9, adult: Secondary | ICD-10-CM | POA: Diagnosis not present

## 2012-10-26 DIAGNOSIS — E782 Mixed hyperlipidemia: Secondary | ICD-10-CM | POA: Diagnosis not present

## 2012-10-26 DIAGNOSIS — E039 Hypothyroidism, unspecified: Secondary | ICD-10-CM | POA: Diagnosis not present

## 2012-10-26 DIAGNOSIS — I10 Essential (primary) hypertension: Secondary | ICD-10-CM | POA: Diagnosis not present

## 2012-11-16 DIAGNOSIS — R220 Localized swelling, mass and lump, head: Secondary | ICD-10-CM | POA: Diagnosis not present

## 2012-11-16 DIAGNOSIS — R221 Localized swelling, mass and lump, neck: Secondary | ICD-10-CM | POA: Diagnosis not present

## 2012-11-16 DIAGNOSIS — H6120 Impacted cerumen, unspecified ear: Secondary | ICD-10-CM | POA: Diagnosis not present

## 2012-12-30 DIAGNOSIS — R350 Frequency of micturition: Secondary | ICD-10-CM | POA: Diagnosis not present

## 2012-12-30 DIAGNOSIS — R3129 Other microscopic hematuria: Secondary | ICD-10-CM | POA: Diagnosis not present

## 2012-12-30 DIAGNOSIS — N3020 Other chronic cystitis without hematuria: Secondary | ICD-10-CM | POA: Diagnosis not present

## 2013-01-13 DIAGNOSIS — IMO0002 Reserved for concepts with insufficient information to code with codable children: Secondary | ICD-10-CM | POA: Diagnosis not present

## 2013-01-13 DIAGNOSIS — N952 Postmenopausal atrophic vaginitis: Secondary | ICD-10-CM | POA: Diagnosis not present

## 2013-02-17 DIAGNOSIS — N952 Postmenopausal atrophic vaginitis: Secondary | ICD-10-CM | POA: Diagnosis not present

## 2013-02-17 DIAGNOSIS — N949 Unspecified condition associated with female genital organs and menstrual cycle: Secondary | ICD-10-CM | POA: Diagnosis not present

## 2013-04-12 DIAGNOSIS — E039 Hypothyroidism, unspecified: Secondary | ICD-10-CM | POA: Diagnosis not present

## 2013-04-12 DIAGNOSIS — I10 Essential (primary) hypertension: Secondary | ICD-10-CM | POA: Diagnosis not present

## 2013-04-12 DIAGNOSIS — R82998 Other abnormal findings in urine: Secondary | ICD-10-CM | POA: Diagnosis not present

## 2013-04-29 DIAGNOSIS — Z1331 Encounter for screening for depression: Secondary | ICD-10-CM | POA: Diagnosis not present

## 2013-04-29 DIAGNOSIS — Z0000 Encounter for general adult medical examination without abnormal findings: Secondary | ICD-10-CM | POA: Diagnosis not present

## 2013-04-29 DIAGNOSIS — I10 Essential (primary) hypertension: Secondary | ICD-10-CM | POA: Diagnosis not present

## 2013-04-29 DIAGNOSIS — E782 Mixed hyperlipidemia: Secondary | ICD-10-CM | POA: Diagnosis not present

## 2013-04-29 DIAGNOSIS — Z23 Encounter for immunization: Secondary | ICD-10-CM | POA: Diagnosis not present

## 2013-04-29 DIAGNOSIS — R3129 Other microscopic hematuria: Secondary | ICD-10-CM | POA: Diagnosis not present

## 2013-06-09 DIAGNOSIS — K2960 Other gastritis without bleeding: Secondary | ICD-10-CM | POA: Diagnosis not present

## 2013-06-29 DIAGNOSIS — M25519 Pain in unspecified shoulder: Secondary | ICD-10-CM | POA: Diagnosis not present

## 2013-08-10 DIAGNOSIS — Z1231 Encounter for screening mammogram for malignant neoplasm of breast: Secondary | ICD-10-CM | POA: Diagnosis not present

## 2013-11-17 DIAGNOSIS — E669 Obesity, unspecified: Secondary | ICD-10-CM | POA: Diagnosis not present

## 2013-11-17 DIAGNOSIS — Z833 Family history of diabetes mellitus: Secondary | ICD-10-CM | POA: Diagnosis not present

## 2013-11-17 DIAGNOSIS — Z6831 Body mass index (BMI) 31.0-31.9, adult: Secondary | ICD-10-CM | POA: Diagnosis not present

## 2013-11-30 DIAGNOSIS — L570 Actinic keratosis: Secondary | ICD-10-CM | POA: Diagnosis not present

## 2013-11-30 DIAGNOSIS — D485 Neoplasm of uncertain behavior of skin: Secondary | ICD-10-CM | POA: Diagnosis not present

## 2013-11-30 DIAGNOSIS — D235 Other benign neoplasm of skin of trunk: Secondary | ICD-10-CM | POA: Diagnosis not present

## 2014-01-04 DIAGNOSIS — R3129 Other microscopic hematuria: Secondary | ICD-10-CM | POA: Diagnosis not present

## 2014-01-04 DIAGNOSIS — N3020 Other chronic cystitis without hematuria: Secondary | ICD-10-CM | POA: Diagnosis not present

## 2014-01-13 DIAGNOSIS — B009 Herpesviral infection, unspecified: Secondary | ICD-10-CM | POA: Diagnosis not present

## 2014-03-04 DIAGNOSIS — H2510 Age-related nuclear cataract, unspecified eye: Secondary | ICD-10-CM | POA: Diagnosis not present

## 2014-04-27 DIAGNOSIS — I10 Essential (primary) hypertension: Secondary | ICD-10-CM | POA: Diagnosis not present

## 2014-04-27 DIAGNOSIS — E782 Mixed hyperlipidemia: Secondary | ICD-10-CM | POA: Diagnosis not present

## 2014-04-27 DIAGNOSIS — R8290 Unspecified abnormal findings in urine: Secondary | ICD-10-CM | POA: Diagnosis not present

## 2014-04-27 DIAGNOSIS — E039 Hypothyroidism, unspecified: Secondary | ICD-10-CM | POA: Diagnosis not present

## 2014-05-04 DIAGNOSIS — E782 Mixed hyperlipidemia: Secondary | ICD-10-CM | POA: Diagnosis not present

## 2014-05-04 DIAGNOSIS — Z0000 Encounter for general adult medical examination without abnormal findings: Secondary | ICD-10-CM | POA: Diagnosis not present

## 2014-05-04 DIAGNOSIS — I10 Essential (primary) hypertension: Secondary | ICD-10-CM | POA: Diagnosis not present

## 2014-05-04 DIAGNOSIS — Z23 Encounter for immunization: Secondary | ICD-10-CM | POA: Diagnosis not present

## 2014-05-04 DIAGNOSIS — E039 Hypothyroidism, unspecified: Secondary | ICD-10-CM | POA: Diagnosis not present

## 2014-05-04 DIAGNOSIS — Z6831 Body mass index (BMI) 31.0-31.9, adult: Secondary | ICD-10-CM | POA: Diagnosis not present

## 2014-05-26 DIAGNOSIS — Z1212 Encounter for screening for malignant neoplasm of rectum: Secondary | ICD-10-CM | POA: Diagnosis not present

## 2014-08-08 ENCOUNTER — Other Ambulatory Visit: Payer: Self-pay | Admitting: Gastroenterology

## 2014-08-08 DIAGNOSIS — R1032 Left lower quadrant pain: Secondary | ICD-10-CM

## 2014-08-08 DIAGNOSIS — R102 Pelvic and perineal pain: Secondary | ICD-10-CM | POA: Diagnosis not present

## 2014-08-08 DIAGNOSIS — K5900 Constipation, unspecified: Secondary | ICD-10-CM | POA: Diagnosis not present

## 2014-08-08 DIAGNOSIS — N200 Calculus of kidney: Secondary | ICD-10-CM | POA: Diagnosis not present

## 2014-08-08 DIAGNOSIS — K5792 Diverticulitis of intestine, part unspecified, without perforation or abscess without bleeding: Secondary | ICD-10-CM | POA: Diagnosis not present

## 2014-08-08 DIAGNOSIS — K625 Hemorrhage of anus and rectum: Secondary | ICD-10-CM | POA: Diagnosis not present

## 2014-08-08 DIAGNOSIS — K649 Unspecified hemorrhoids: Secondary | ICD-10-CM | POA: Diagnosis not present

## 2014-08-08 DIAGNOSIS — R109 Unspecified abdominal pain: Secondary | ICD-10-CM | POA: Diagnosis not present

## 2014-08-09 ENCOUNTER — Ambulatory Visit
Admission: RE | Admit: 2014-08-09 | Discharge: 2014-08-09 | Disposition: A | Discharge status: Home / Self Care | Payer: Medicare Other | Source: Ambulatory Visit | Attending: Gastroenterology | Admitting: Gastroenterology

## 2014-08-09 DIAGNOSIS — R1032 Left lower quadrant pain: Secondary | ICD-10-CM

## 2014-08-09 DIAGNOSIS — K5732 Diverticulitis of large intestine without perforation or abscess without bleeding: Secondary | ICD-10-CM | POA: Diagnosis not present

## 2014-08-09 DIAGNOSIS — K625 Hemorrhage of anus and rectum: Secondary | ICD-10-CM

## 2014-08-09 DIAGNOSIS — N200 Calculus of kidney: Secondary | ICD-10-CM | POA: Diagnosis not present

## 2014-08-09 MED ORDER — IOHEXOL 300 MG/ML  SOLN
100.0000 mL | Freq: Once | INTRAMUSCULAR | Status: AC | PRN
  Administered 2014-08-09: 100 mL via INTRAVENOUS

## 2014-08-15 DIAGNOSIS — K5792 Diverticulitis of intestine, part unspecified, without perforation or abscess without bleeding: Secondary | ICD-10-CM | POA: Diagnosis not present

## 2014-08-15 DIAGNOSIS — K219 Gastro-esophageal reflux disease without esophagitis: Secondary | ICD-10-CM | POA: Diagnosis not present

## 2014-08-15 DIAGNOSIS — K5900 Constipation, unspecified: Secondary | ICD-10-CM | POA: Diagnosis not present

## 2014-10-05 DIAGNOSIS — Z1231 Encounter for screening mammogram for malignant neoplasm of breast: Secondary | ICD-10-CM | POA: Diagnosis not present

## 2014-10-17 DIAGNOSIS — R635 Abnormal weight gain: Secondary | ICD-10-CM | POA: Diagnosis not present

## 2014-10-17 DIAGNOSIS — E785 Hyperlipidemia, unspecified: Secondary | ICD-10-CM | POA: Diagnosis not present

## 2014-10-17 DIAGNOSIS — N951 Menopausal and female climacteric states: Secondary | ICD-10-CM | POA: Diagnosis not present

## 2014-10-17 DIAGNOSIS — R7301 Impaired fasting glucose: Secondary | ICD-10-CM | POA: Diagnosis not present

## 2014-10-17 DIAGNOSIS — E039 Hypothyroidism, unspecified: Secondary | ICD-10-CM | POA: Diagnosis not present

## 2014-10-17 DIAGNOSIS — E559 Vitamin D deficiency, unspecified: Secondary | ICD-10-CM | POA: Diagnosis not present

## 2014-10-17 DIAGNOSIS — R5383 Other fatigue: Secondary | ICD-10-CM | POA: Diagnosis not present

## 2014-10-20 DIAGNOSIS — I10 Essential (primary) hypertension: Secondary | ICD-10-CM | POA: Diagnosis not present

## 2014-10-20 DIAGNOSIS — Z6832 Body mass index (BMI) 32.0-32.9, adult: Secondary | ICD-10-CM | POA: Diagnosis not present

## 2014-10-20 DIAGNOSIS — E8881 Metabolic syndrome: Secondary | ICD-10-CM | POA: Diagnosis not present

## 2014-10-20 DIAGNOSIS — E785 Hyperlipidemia, unspecified: Secondary | ICD-10-CM | POA: Diagnosis not present

## 2014-10-20 DIAGNOSIS — N958 Other specified menopausal and perimenopausal disorders: Secondary | ICD-10-CM | POA: Diagnosis not present

## 2014-10-20 DIAGNOSIS — E538 Deficiency of other specified B group vitamins: Secondary | ICD-10-CM | POA: Diagnosis not present

## 2014-10-25 DIAGNOSIS — Z6832 Body mass index (BMI) 32.0-32.9, adult: Secondary | ICD-10-CM | POA: Diagnosis not present

## 2014-10-25 DIAGNOSIS — I10 Essential (primary) hypertension: Secondary | ICD-10-CM | POA: Diagnosis not present

## 2014-10-25 DIAGNOSIS — N958 Other specified menopausal and perimenopausal disorders: Secondary | ICD-10-CM | POA: Diagnosis not present

## 2014-10-25 DIAGNOSIS — E8881 Metabolic syndrome: Secondary | ICD-10-CM | POA: Diagnosis not present

## 2014-10-25 DIAGNOSIS — E538 Deficiency of other specified B group vitamins: Secondary | ICD-10-CM | POA: Diagnosis not present

## 2014-10-25 DIAGNOSIS — E039 Hypothyroidism, unspecified: Secondary | ICD-10-CM | POA: Diagnosis not present

## 2014-10-25 DIAGNOSIS — E785 Hyperlipidemia, unspecified: Secondary | ICD-10-CM | POA: Diagnosis not present

## 2014-11-28 DIAGNOSIS — E538 Deficiency of other specified B group vitamins: Secondary | ICD-10-CM | POA: Diagnosis not present

## 2014-11-28 DIAGNOSIS — E785 Hyperlipidemia, unspecified: Secondary | ICD-10-CM | POA: Diagnosis not present

## 2014-11-28 DIAGNOSIS — N958 Other specified menopausal and perimenopausal disorders: Secondary | ICD-10-CM | POA: Diagnosis not present

## 2014-11-28 DIAGNOSIS — E8881 Metabolic syndrome: Secondary | ICD-10-CM | POA: Diagnosis not present

## 2014-11-28 DIAGNOSIS — Z6832 Body mass index (BMI) 32.0-32.9, adult: Secondary | ICD-10-CM | POA: Diagnosis not present

## 2014-11-28 DIAGNOSIS — I10 Essential (primary) hypertension: Secondary | ICD-10-CM | POA: Diagnosis not present

## 2014-12-01 DIAGNOSIS — N958 Other specified menopausal and perimenopausal disorders: Secondary | ICD-10-CM | POA: Diagnosis not present

## 2015-01-10 DIAGNOSIS — R312 Other microscopic hematuria: Secondary | ICD-10-CM | POA: Diagnosis not present

## 2015-01-10 DIAGNOSIS — N3020 Other chronic cystitis without hematuria: Secondary | ICD-10-CM | POA: Diagnosis not present

## 2015-01-10 DIAGNOSIS — N281 Cyst of kidney, acquired: Secondary | ICD-10-CM | POA: Diagnosis not present

## 2015-01-10 DIAGNOSIS — R828 Abnormal findings on cytological and histological examination of urine: Secondary | ICD-10-CM | POA: Diagnosis not present

## 2015-01-19 DIAGNOSIS — N907 Vulvar cyst: Secondary | ICD-10-CM | POA: Diagnosis not present

## 2015-01-19 DIAGNOSIS — N952 Postmenopausal atrophic vaginitis: Secondary | ICD-10-CM | POA: Diagnosis not present

## 2015-05-03 DIAGNOSIS — I10 Essential (primary) hypertension: Secondary | ICD-10-CM | POA: Diagnosis not present

## 2015-05-03 DIAGNOSIS — R8290 Unspecified abnormal findings in urine: Secondary | ICD-10-CM | POA: Diagnosis not present

## 2015-05-03 DIAGNOSIS — N390 Urinary tract infection, site not specified: Secondary | ICD-10-CM | POA: Diagnosis not present

## 2015-05-03 DIAGNOSIS — E039 Hypothyroidism, unspecified: Secondary | ICD-10-CM | POA: Diagnosis not present

## 2015-05-10 DIAGNOSIS — R7301 Impaired fasting glucose: Secondary | ICD-10-CM | POA: Diagnosis not present

## 2015-05-10 DIAGNOSIS — E039 Hypothyroidism, unspecified: Secondary | ICD-10-CM | POA: Diagnosis not present

## 2015-05-10 DIAGNOSIS — I129 Hypertensive chronic kidney disease with stage 1 through stage 4 chronic kidney disease, or unspecified chronic kidney disease: Secondary | ICD-10-CM | POA: Diagnosis not present

## 2015-05-10 DIAGNOSIS — Z0000 Encounter for general adult medical examination without abnormal findings: Secondary | ICD-10-CM | POA: Diagnosis not present

## 2015-05-10 DIAGNOSIS — N183 Chronic kidney disease, stage 3 (moderate): Secondary | ICD-10-CM | POA: Diagnosis not present

## 2015-05-10 DIAGNOSIS — E782 Mixed hyperlipidemia: Secondary | ICD-10-CM | POA: Diagnosis not present

## 2015-05-10 DIAGNOSIS — Z6830 Body mass index (BMI) 30.0-30.9, adult: Secondary | ICD-10-CM | POA: Diagnosis not present

## 2015-05-10 DIAGNOSIS — Z1389 Encounter for screening for other disorder: Secondary | ICD-10-CM | POA: Diagnosis not present

## 2015-05-10 DIAGNOSIS — Z23 Encounter for immunization: Secondary | ICD-10-CM | POA: Diagnosis not present

## 2015-05-10 DIAGNOSIS — I10 Essential (primary) hypertension: Secondary | ICD-10-CM | POA: Diagnosis not present

## 2015-05-11 DIAGNOSIS — Z1212 Encounter for screening for malignant neoplasm of rectum: Secondary | ICD-10-CM | POA: Diagnosis not present

## 2015-05-16 DIAGNOSIS — N183 Chronic kidney disease, stage 3 (moderate): Secondary | ICD-10-CM | POA: Diagnosis not present

## 2015-05-16 DIAGNOSIS — I10 Essential (primary) hypertension: Secondary | ICD-10-CM | POA: Diagnosis not present

## 2015-05-16 DIAGNOSIS — Z13820 Encounter for screening for osteoporosis: Secondary | ICD-10-CM | POA: Diagnosis not present

## 2015-07-04 DIAGNOSIS — H6121 Impacted cerumen, right ear: Secondary | ICD-10-CM | POA: Diagnosis not present

## 2015-07-04 DIAGNOSIS — H60331 Swimmer's ear, right ear: Secondary | ICD-10-CM | POA: Diagnosis not present

## 2015-07-04 DIAGNOSIS — R42 Dizziness and giddiness: Secondary | ICD-10-CM | POA: Diagnosis not present

## 2015-10-13 DIAGNOSIS — R110 Nausea: Secondary | ICD-10-CM | POA: Diagnosis not present

## 2015-10-13 DIAGNOSIS — Z86010 Personal history of colonic polyps: Secondary | ICD-10-CM | POA: Diagnosis not present

## 2015-10-20 DIAGNOSIS — R110 Nausea: Secondary | ICD-10-CM | POA: Diagnosis not present

## 2015-11-16 DIAGNOSIS — R42 Dizziness and giddiness: Secondary | ICD-10-CM | POA: Diagnosis not present

## 2015-11-20 DIAGNOSIS — Z6830 Body mass index (BMI) 30.0-30.9, adult: Secondary | ICD-10-CM | POA: Diagnosis not present

## 2015-11-20 DIAGNOSIS — I129 Hypertensive chronic kidney disease with stage 1 through stage 4 chronic kidney disease, or unspecified chronic kidney disease: Secondary | ICD-10-CM | POA: Diagnosis not present

## 2015-11-20 DIAGNOSIS — Z86010 Personal history of colonic polyps: Secondary | ICD-10-CM | POA: Diagnosis not present

## 2015-11-20 DIAGNOSIS — N183 Chronic kidney disease, stage 3 (moderate): Secondary | ICD-10-CM | POA: Diagnosis not present

## 2015-11-20 DIAGNOSIS — I10 Essential (primary) hypertension: Secondary | ICD-10-CM | POA: Diagnosis not present

## 2015-11-20 DIAGNOSIS — E038 Other specified hypothyroidism: Secondary | ICD-10-CM | POA: Diagnosis not present

## 2015-11-20 DIAGNOSIS — R110 Nausea: Secondary | ICD-10-CM | POA: Diagnosis not present

## 2015-11-20 DIAGNOSIS — E782 Mixed hyperlipidemia: Secondary | ICD-10-CM | POA: Diagnosis not present

## 2015-11-20 DIAGNOSIS — Z800 Family history of malignant neoplasm of digestive organs: Secondary | ICD-10-CM | POA: Diagnosis not present

## 2015-11-20 DIAGNOSIS — R7302 Impaired glucose tolerance (oral): Secondary | ICD-10-CM | POA: Diagnosis not present

## 2015-11-20 DIAGNOSIS — Z1389 Encounter for screening for other disorder: Secondary | ICD-10-CM | POA: Diagnosis not present

## 2015-12-07 DIAGNOSIS — H2513 Age-related nuclear cataract, bilateral: Secondary | ICD-10-CM | POA: Diagnosis not present

## 2016-01-11 DIAGNOSIS — N3020 Other chronic cystitis without hematuria: Secondary | ICD-10-CM | POA: Diagnosis not present

## 2016-01-11 DIAGNOSIS — Z792 Long term (current) use of antibiotics: Secondary | ICD-10-CM | POA: Diagnosis not present

## 2016-01-11 DIAGNOSIS — Z1231 Encounter for screening mammogram for malignant neoplasm of breast: Secondary | ICD-10-CM | POA: Diagnosis not present

## 2016-02-05 DIAGNOSIS — A048 Other specified bacterial intestinal infections: Secondary | ICD-10-CM | POA: Diagnosis not present

## 2016-02-05 DIAGNOSIS — R110 Nausea: Secondary | ICD-10-CM | POA: Diagnosis not present

## 2016-02-07 DIAGNOSIS — A048 Other specified bacterial intestinal infections: Secondary | ICD-10-CM | POA: Diagnosis not present

## 2016-05-06 DIAGNOSIS — R8299 Other abnormal findings in urine: Secondary | ICD-10-CM | POA: Diagnosis not present

## 2016-05-06 DIAGNOSIS — R7309 Other abnormal glucose: Secondary | ICD-10-CM | POA: Diagnosis not present

## 2016-05-06 DIAGNOSIS — I10 Essential (primary) hypertension: Secondary | ICD-10-CM | POA: Diagnosis not present

## 2016-05-06 DIAGNOSIS — N183 Chronic kidney disease, stage 3 (moderate): Secondary | ICD-10-CM | POA: Diagnosis not present

## 2016-05-06 DIAGNOSIS — E038 Other specified hypothyroidism: Secondary | ICD-10-CM | POA: Diagnosis not present

## 2016-05-06 DIAGNOSIS — N390 Urinary tract infection, site not specified: Secondary | ICD-10-CM | POA: Diagnosis not present

## 2016-05-13 DIAGNOSIS — E038 Other specified hypothyroidism: Secondary | ICD-10-CM | POA: Diagnosis not present

## 2016-05-13 DIAGNOSIS — Z0000 Encounter for general adult medical examination without abnormal findings: Secondary | ICD-10-CM | POA: Diagnosis not present

## 2016-05-13 DIAGNOSIS — Z1389 Encounter for screening for other disorder: Secondary | ICD-10-CM | POA: Diagnosis not present

## 2016-05-13 DIAGNOSIS — N183 Chronic kidney disease, stage 3 (moderate): Secondary | ICD-10-CM | POA: Diagnosis not present

## 2016-05-13 DIAGNOSIS — I129 Hypertensive chronic kidney disease with stage 1 through stage 4 chronic kidney disease, or unspecified chronic kidney disease: Secondary | ICD-10-CM | POA: Diagnosis not present

## 2016-05-13 DIAGNOSIS — I10 Essential (primary) hypertension: Secondary | ICD-10-CM | POA: Diagnosis not present

## 2016-05-13 DIAGNOSIS — Z23 Encounter for immunization: Secondary | ICD-10-CM | POA: Diagnosis not present

## 2016-05-13 DIAGNOSIS — R7301 Impaired fasting glucose: Secondary | ICD-10-CM | POA: Diagnosis not present

## 2016-05-13 DIAGNOSIS — Z6831 Body mass index (BMI) 31.0-31.9, adult: Secondary | ICD-10-CM | POA: Diagnosis not present

## 2016-05-16 DIAGNOSIS — H10412 Chronic giant papillary conjunctivitis, left eye: Secondary | ICD-10-CM | POA: Diagnosis not present

## 2016-05-22 DIAGNOSIS — X32XXXD Exposure to sunlight, subsequent encounter: Secondary | ICD-10-CM | POA: Diagnosis not present

## 2016-05-22 DIAGNOSIS — L570 Actinic keratosis: Secondary | ICD-10-CM | POA: Diagnosis not present

## 2016-05-22 DIAGNOSIS — D225 Melanocytic nevi of trunk: Secondary | ICD-10-CM | POA: Diagnosis not present

## 2016-05-29 DIAGNOSIS — H10413 Chronic giant papillary conjunctivitis, bilateral: Secondary | ICD-10-CM | POA: Diagnosis not present

## 2016-08-09 DIAGNOSIS — B379 Candidiasis, unspecified: Secondary | ICD-10-CM | POA: Diagnosis not present

## 2016-08-09 DIAGNOSIS — K5792 Diverticulitis of intestine, part unspecified, without perforation or abscess without bleeding: Secondary | ICD-10-CM | POA: Diagnosis not present

## 2016-08-09 DIAGNOSIS — R1031 Right lower quadrant pain: Secondary | ICD-10-CM | POA: Diagnosis not present

## 2016-08-23 DIAGNOSIS — K5792 Diverticulitis of intestine, part unspecified, without perforation or abscess without bleeding: Secondary | ICD-10-CM | POA: Diagnosis not present

## 2016-08-23 DIAGNOSIS — Z86010 Personal history of colonic polyps: Secondary | ICD-10-CM | POA: Diagnosis not present

## 2016-11-11 DIAGNOSIS — E782 Mixed hyperlipidemia: Secondary | ICD-10-CM | POA: Diagnosis not present

## 2016-11-11 DIAGNOSIS — N183 Chronic kidney disease, stage 3 (moderate): Secondary | ICD-10-CM | POA: Diagnosis not present

## 2016-11-11 DIAGNOSIS — R7301 Impaired fasting glucose: Secondary | ICD-10-CM | POA: Diagnosis not present

## 2016-11-11 DIAGNOSIS — I129 Hypertensive chronic kidney disease with stage 1 through stage 4 chronic kidney disease, or unspecified chronic kidney disease: Secondary | ICD-10-CM | POA: Diagnosis not present

## 2016-11-11 DIAGNOSIS — I10 Essential (primary) hypertension: Secondary | ICD-10-CM | POA: Diagnosis not present

## 2016-11-11 DIAGNOSIS — Z1389 Encounter for screening for other disorder: Secondary | ICD-10-CM | POA: Diagnosis not present

## 2016-11-11 DIAGNOSIS — E038 Other specified hypothyroidism: Secondary | ICD-10-CM | POA: Diagnosis not present

## 2017-01-20 DIAGNOSIS — Z1231 Encounter for screening mammogram for malignant neoplasm of breast: Secondary | ICD-10-CM | POA: Diagnosis not present

## 2017-01-20 DIAGNOSIS — N3020 Other chronic cystitis without hematuria: Secondary | ICD-10-CM | POA: Diagnosis not present

## 2017-01-20 DIAGNOSIS — N200 Calculus of kidney: Secondary | ICD-10-CM | POA: Diagnosis not present

## 2017-01-21 ENCOUNTER — Other Ambulatory Visit: Payer: Self-pay | Admitting: Urology

## 2017-01-24 ENCOUNTER — Encounter (HOSPITAL_COMMUNITY): Payer: Self-pay | Admitting: *Deleted

## 2017-01-27 ENCOUNTER — Ambulatory Visit (HOSPITAL_COMMUNITY): Payer: Medicare Other

## 2017-01-27 ENCOUNTER — Encounter (HOSPITAL_COMMUNITY)
Admission: RE | Disposition: A | Discharge status: Home / Self Care | Payer: Self-pay | Source: Ambulatory Visit | Attending: Urology

## 2017-01-27 ENCOUNTER — Encounter (HOSPITAL_COMMUNITY): Payer: Self-pay | Admitting: *Deleted

## 2017-01-27 ENCOUNTER — Ambulatory Visit (HOSPITAL_COMMUNITY)
Admission: RE | Admit: 2017-01-27 | Discharge: 2017-01-27 | Disposition: A | Discharge status: Home / Self Care | Payer: Medicare Other | Source: Ambulatory Visit | Attending: Urology | Admitting: Urology

## 2017-01-27 DIAGNOSIS — Z79899 Other long term (current) drug therapy: Secondary | ICD-10-CM | POA: Insufficient documentation

## 2017-01-27 DIAGNOSIS — N3021 Other chronic cystitis with hematuria: Secondary | ICD-10-CM | POA: Insufficient documentation

## 2017-01-27 DIAGNOSIS — I10 Essential (primary) hypertension: Secondary | ICD-10-CM | POA: Diagnosis not present

## 2017-01-27 DIAGNOSIS — E785 Hyperlipidemia, unspecified: Secondary | ICD-10-CM | POA: Diagnosis not present

## 2017-01-27 DIAGNOSIS — Z792 Long term (current) use of antibiotics: Secondary | ICD-10-CM | POA: Diagnosis not present

## 2017-01-27 DIAGNOSIS — N200 Calculus of kidney: Secondary | ICD-10-CM | POA: Diagnosis not present

## 2017-01-27 HISTORY — DX: Personal history of urinary calculi: Z87.442

## 2017-01-27 HISTORY — DX: Personal history of urinary (tract) infections: Z87.440

## 2017-01-27 HISTORY — PX: EXTRACORPOREAL SHOCK WAVE LITHOTRIPSY: SHX1557

## 2017-01-27 HISTORY — DX: Pure hypercholesterolemia, unspecified: E78.00

## 2017-01-27 HISTORY — DX: Sepsis, unspecified organism: A41.9

## 2017-01-27 HISTORY — DX: Hypothyroidism, unspecified: E03.9

## 2017-01-27 SURGERY — LITHOTRIPSY, ESWL
Anesthesia: LOCAL | Laterality: Right

## 2017-01-27 MED ORDER — DIPHENHYDRAMINE HCL 25 MG PO CAPS: 25.0000 mg | ORAL_CAPSULE | ORAL | Status: AC

## 2017-01-27 MED ORDER — DIPHENHYDRAMINE HCL 25 MG PO CAPS
25.0000 mg | ORAL_CAPSULE | ORAL | Status: AC
  Administered 2017-01-27: 25 mg via ORAL
  Filled 2017-01-27: qty 1

## 2017-01-27 MED ORDER — CIPROFLOXACIN HCL 500 MG PO TABS: 500.0000 mg | ORAL_TABLET | ORAL | Status: AC

## 2017-01-27 MED ORDER — DIAZEPAM 5 MG PO TABS: 10.0000 mg | ORAL_TABLET | ORAL | Status: AC

## 2017-01-27 MED ORDER — DIAZEPAM 5 MG PO TABS
10.0000 mg | ORAL_TABLET | ORAL | Status: AC
  Administered 2017-01-27: 10 mg via ORAL
  Filled 2017-01-27: qty 2

## 2017-01-27 MED ORDER — SODIUM CHLORIDE 0.9 % IV SOLN
INTRAVENOUS | Status: DC
  Administered 2017-01-27: 07:00:00 via INTRAVENOUS

## 2017-01-27 MED ORDER — HYDROCODONE-ACETAMINOPHEN 5-325 MG PO TABS: 1.0000 | ORAL_TABLET | Freq: Four times a day (QID) | ORAL | 0 refills | Status: DC | PRN

## 2017-01-27 MED ORDER — CIPROFLOXACIN HCL 500 MG PO TABS
500.0000 mg | ORAL_TABLET | ORAL | Status: AC
  Administered 2017-01-27: 500 mg via ORAL
  Filled 2017-01-27: qty 1

## 2017-01-27 MED ORDER — SODIUM CHLORIDE 0.9 % IV SOLN: INTRAVENOUS | Status: AC

## 2017-01-27 NOTE — Discharge Instructions (Signed)
Lithotripsy, Care After °This sheet gives you information about how to care for yourself after your procedure. Your health care provider may also give you more specific instructions. If you have problems or questions, contact your health care provider. °What can I expect after the procedure? °After the procedure, it is common to have: °· Some blood in your urine. This should only last for a few days. °· Soreness in your back, sides, or upper abdomen for a few days. °· Blotches or bruises on your back where the pressure wave entered the skin. °· Pain, discomfort, or nausea when pieces (fragments) of the kidney stone move through the tube that carries urine from the kidney to the bladder (ureter). Stone fragments may pass soon after the procedure, but they may continue to pass for up to 4-8 weeks. °? If you have severe pain or nausea, contact your health care provider. This may be caused by a large stone that was not broken up, and this may mean that you need more treatment. °· Some pain or discomfort during urination. °· Some pain or discomfort in the lower abdomen or (in men) at the base of the penis. ° °Follow these instructions at home: °Medicines °· Take over-the-counter and prescription medicines only as told by your health care provider. °· If you were prescribed an antibiotic medicine, take it as told by your health care provider. Do not stop taking the antibiotic even if you start to feel better. °· Do not drive for 24 hours if you were given a medicine to help you relax (sedative). °· Do not drive or use heavy machinery while taking prescription pain medicine. °Eating and drinking °· Drink enough water and fluids to keep your urine clear or pale yellow. This helps any remaining pieces of the stone to pass. It can also help prevent new stones from forming. °· Eat plenty of fresh fruits and vegetables. °· Follow instructions from your health care provider about eating and drinking restrictions. You may be  instructed: °? To reduce how much salt (sodium) you eat or drink. Check ingredients and nutrition facts on packaged foods and beverages. °? To reduce how much meat you eat. °· Eat the recommended amount of calcium for your age and gender. Ask your health care provider how much calcium you should have. °General instructions °· Get plenty of rest. °· Most people can resume normal activities 1-2 days after the procedure. Ask your health care provider what activities are safe for you. °· If directed, strain all urine through the strainer that was provided by your health care provider. °? Keep all fragments for your health care provider to see. Any stones that are found may be sent to a medical lab for examination. The stone may be as small as a grain of salt. °· Keep all follow-up visits as told by your health care provider. This is important. °Contact a health care provider if: °· You have pain that is severe or does not get better with medicine. °· You have nausea that is severe or does not go away. °· You have blood in your urine longer than your health care provider told you to expect. °· You have more blood in your urine. °· You have pain during urination that does not go away. °· You urinate more frequently than usual and this does not go away. °· You develop a rash or any other possible signs of an allergic reaction. °Get help right away if: °· You have severe pain in   your back, sides, or upper abdomen.  You have severe pain while urinating.  Your urine is very dark red.  You have blood in your stool (feces).  You cannot pass any urine at all.  You feel a strong urge to urinate after emptying your bladder.  You have a fever or chills.  You develop shortness of breath, difficulty breathing, or chest pain.  You have severe nausea that leads to persistent vomiting.  You faint. Summary  After this procedure, it is common to have some pain, discomfort, or nausea when pieces (fragments) of the  kidney stone move through the tube that carries urine from the kidney to the bladder (ureter). If this pain or nausea is severe, however, you should contact your health care provider.  Most people can resume normal activities 1-2 days after the procedure. Ask your health care provider what activities are safe for you.  Drink enough water and fluids to keep your urine clear or pale yellow. This helps any remaining pieces of the stone to pass, and it can help prevent new stones from forming.  If directed, strain your urine and keep all fragments for your health care provider to see. Fragments or stones may be as small as a grain of salt.  Get help right away if you have severe pain in your back, sides, or upper abdomen or have severe pain while urinating. This information is not intended to replace advice given to you by your health care provider. Make sure you discuss any questions you have with your health care provider. Document Released: 07/21/2007 Document Revised: 05/22/2016 Document Reviewed: 05/22/2016 Elsevier Interactive Patient Education  2017 [employer]. Moderate Conscious Sedation, Adult, Care After These instructions provide you with information about caring for yourself after your procedure. Your health care provider may also give you more specific instructions. Your treatment has been planned according to current medical practices, but problems sometimes occur. Call your health care provider if you have any problems or questions after your procedure. What can I expect after the procedure? After your procedure, it is common:  To feel sleepy for several hours.  To feel clumsy and have poor balance for several hours.  To have poor judgment for several hours.  To vomit if you eat too soon.  Follow these instructions at home: For at least 24 hours after the procedure:   Do not: ? Participate in activities where you could fall or become injured. ? Drive. ? Use heavy  machinery. ? Drink alcohol. ? Take sleeping pills or medicines that cause drowsiness. ? Make important decisions or sign legal documents. ? Take care of children on your own.  Rest. Eating and drinking  Follow the diet recommended by your health care provider.  If you vomit: ? Drink water, juice, or soup when you can drink without vomiting. ? Make sure you have little or no nausea before eating solid foods. General instructions  Have a responsible adult stay with you until you are awake and alert.  Take over-the-counter and prescription medicines only as told by your health care provider.  If you smoke, do not smoke without supervision.  Keep all follow-up visits as told by your health care provider. This is important. Contact a health care provider if:  You keep feeling nauseous or you keep vomiting.  You feel light-headed.  You develop a rash.  You have a fever. Get help right away if:  You have trouble breathing. This information is not intended to replace advice  given to you by your health care provider. Make sure you discuss any questions you have with your health care provider. Document Released: 04/21/2013 Document Revised: 12/04/2015 Document Reviewed: 10/21/2015 Elsevier Interactive Patient Education  2018 [place] discharge instructions in chart.     May restart aspirin in 5 days May restart naproxen in 2 days

## 2017-01-27 NOTE — H&P (Signed)
Office Visit Report     01/20/2017   --------------------------------------------------------------------------------   Joanna Johnston  MRN: [id number]  PRIMARY CARE:  Richard A. Reynaldo Minium, MD  DOB: Nov 16, 1940, 76 year old Female  REFERRING:  Kathie Rhodes, MD  SSN: -[id number]  PROVIDER:  Kathie Rhodes, M.D.    LOCATION:  [facility] [phone]   --------------------------------------------------------------------------------   CC: I have chronic cystitis.  HPI: Joanna Johnston is a 76 year-old female established patient who is here for chronic cystitis.  She does not have a burning sensation when she urinates. She does not have to strain or bear down to start her urinary stream. She is not having problems getting her urine stream started. She is currently in a monogamous relationship. She is having problems emptying her bladder. She did not see the blood in her unine.   She is not having problems with urinary control or incontinence. She is not urinating more frequently now than usual.   She does not have pelvic or rectal pain related to voiding.   01/20/17: She has been doing well over the past year with no breakthrough infections. She remains on low-dose nightly nitrofurantoin prophylaxis. She has not developed any side effects. She does not have any pulmonary complaints.     CC: I have kidney stones.  HPI: The problem is on the right side. Her symptoms include blood in urine. Patient denies having flank pain, back pain, groin pain, nausea, vomiting, fever, and chills. This is not her first kidney stone.   She has never had surgical treatment for calculi in the past.   01/20/17: She continues to experience gross hematuria after she is extremely active such as hiking [place] 15 miles on the [employer]. She does not experience any flank pain or colic. She says the only time that she sees hematuria is when she does have some right flank pain.     ALLERGIES: Codeine  Derivatives    MEDICATIONS: Crestor  Aspir-81 81 MG Oral Tablet Delayed Release Oral  Metformin Hcl Er 500 mg tablet, extended release 24 hr Oral  Omeprazole 40 mg capsule,delayed release Oral  Synthroid 75 MCG Oral Tablet Oral  Vitamin B-12 100 mcg tablet Oral  Vitamin D3 1,000 unit tablet Oral  Vytorin 10-40 MG Oral Tablet Oral     GU PSH: Cystoscopy Ureteroscopy - 2010      PSH Notes: Bunion Correct Resect Joint Swanson Flex First Toe Implant, Thyroid Surgery, Cystoscopy With Ureteroscopy Right   NON-GU PSH: Correct Bunion - 2010    GU PMH: Abnormal urine findings, Cytological and Histological exam, Abnormal urine cytology - 2016 Chronic cystitis (w/o hematuria), Chronic cystitis - 2016 Other microscopic hematuria, Microscopic hematuria - 2016 Renal cyst, Renal cyst, acquired, right - 2016 Pelvic/perineal pain, Suprapubic pain - 2016 Renal calculus, Kidney stone on right side - 2016 Hemorrhagic cystitis, Acute Hemorrhagic Cystitis - 2014 Urinary Frequency, Increased urinary frequency - 2014      PMH Notes: Microscopic and gross hematuria - She was evaluated for this in 10/06 with a CT scan that revealed a single stone in the lower pole of her right kidney. Cystoscopy at that time was negative. Re-evaluation with CT scan in 11/10 due to an episode of gross hematuria revealed a 5 mm stone in the midpole of the right kidney as well as a right renal cyst. Her culture proved positive at that time as the cause of her hematuria. Urine cytology was found to be negative as  well. She continues to have intermittent gross hematuria with the only abnormality being the right renal calculus.   Nephrolithiasis: She passed a stone in the past and has a known stone in the lower pole of her right kidney seen on a CT scan in 3/12 that was nonobstructing with no other calculi noted. That same stone was noted on a renal ultrasound done in 9/12 and on a CT scan done in 1/16 with no other renal calculi  noted.   Simple right renal cyst: This was noted on CT scan done in 1/16 located in the lower pole of the kidney.   Chronic/recurrent cystitis: She has had difficulty with recurrent acute cystitis for many years. She has never been symptomatic from her UTIs. She has however developed E. coli sepsis in the past.  Current Tx: low dose nightly Macrodantin prophylaxis.   History of urosepsis: She has developed urosepsis in the past with minimal voiding symptoms associated with her infection.     NON-GU PMH: Long term (current) use of antibiotics (Stable), She remains on nitrofurantoin nightly. - 01/11/2016 Diverticulitis of intestine, part unspecified, without perforation or abscess without bleeding, Diverticulitis - 2016 Encounter for general adult medical examination without abnormal findings, Encounter for preventive health examination - 2015 Personal history of other diseases of the circulatory system, History of hypertension - 2014 Personal history of other endocrine, nutritional and metabolic disease, History of hyperlipidemia - 2014    FAMILY HISTORY: Blood In Urine - Sister, Daughter Colon Cancer - Father Urinary Calculus - Sister   SOCIAL HISTORY: Marital Status: Married     Notes: Never A Smoker, Alcohol Use, Marital History - Currently Married, Caffeine Use   REVIEW OF SYSTEMS:    GU Review Female:   Patient reports get up at night to urinate. Patient denies frequent urination, hard to postpone urination, burning /pain with urination, leakage of urine, stream starts and stops, trouble starting your stream, have to strain to urinate, and being pregnant.  Gastrointestinal (Upper):   Patient denies nausea, vomiting, and indigestion/ heartburn.  Gastrointestinal (Lower):   Patient denies diarrhea and constipation.  Constitutional:   Patient denies fever, night sweats, weight loss, and fatigue.  Skin:   Patient denies skin rash/ lesion and itching.  Eyes:   Patient denies blurred  vision and double vision.  Ears/ Nose/ Throat:   Patient denies sore throat and sinus problems.  Hematologic/Lymphatic:   Patient denies swollen glands and easy bruising.  Cardiovascular:   Patient denies leg swelling and chest pains.  Respiratory:   Patient denies cough and shortness of breath.  Endocrine:   Patient denies excessive thirst.  Musculoskeletal:   Patient reports back pain. Patient denies joint pain.  Neurological:   Patient denies dizziness and headaches.  Psychologic:   Patient denies depression and anxiety.   VITAL SIGNS:      01/20/2017 01:44 PM  Weight 180 lb / 81.65 kg  Height 68 in / 172.72 cm  BP 132/72 mmHg  Pulse 70 /min  Temperature 97.6 F / 36 C  BMI 27.4 kg/m   PAST DATA REVIEWED:  Source Of History:  Patient  Lab Test Review:   BUN/Creatinine, Calcium  Records Review:   Previous Patient Records, POC Tool  Notes:                     Her creatinine in 10/17 was normal 1.1. Her serum calcium in 10/17 was 9.7.   PROCEDURES:  KUB - [id number]  A single view of the abdomen is obtained.      Her stone measures 6 x 4 mm and is located in the mid to lower pole of the right kidney. By previous CT scan it has Hounsfield units of 700.         Urinalysis w/Scope Dipstick Dipstick Cont'd Micro  Color: Yellow Bilirubin: Neg WBC/hpf: NS (Not Seen)  Appearance: Clear Ketones: Neg RBC/hpf: 3 - 10/hpf  Specific Gravity: 1.020 Blood: 1+ Bacteria: Rare (0-9/hpf)  pH: 6.0 Protein: Neg Cystals: NS (Not Seen)  Glucose: Neg Urobilinogen: 0.2 Casts: NS (Not Seen)    Nitrites: Neg Trichomonas: Not Present    Leukocyte Esterase: Neg Mucous: Not Present      Epithelial Cells: 0 - 5/hpf      Yeast: NS (Not Seen)      Sperm: Not Present    ASSESSMENT:      ICD-10 Details  1 GU:   Chronic cystitis (w/o hematuria) - N30.20 Stable - Her cystitis has been well controlled with low-dose prophylaxis and therefore she will remain on this.  2   Renal calculus - N20.0 Stable -  We discussed the fact that because she continues to have intermittent gross hematuria with activity and with flank pain this would almost certainly suggest her stone as the cause however my recommendation to her was that we proceed with a CT scan using hematuria protocol and cystoscopy. She seems somewhat reluctant to undergo this and therefore I had an alternative suggestion. I told her that we could treat her right renal calculus with lithotripsy which would free her of her single stone and if this then resulted in complete resolution of her intermittent gross hematuria then we know that was the source. If, on the other hand, her stone is treated and all fragments have passed and she continues to see gross hematuria during activity she has promised to return for a complete hematuria evaluation which I think is entirely reasonable. I told her I would send her urine for cytology today as well.     PLAN:            Medications Refill Meds: Nitrofurantoin 100 mg capsule 1 capsule PO Q HS   #30  1 Refill(s)            Orders Labs Urine Cytology  X-Rays: KUB          Schedule Return Visit/Planned Activity: Next Available Appointment - Schedule Surgery          Document Letter(s):  Created for Richard A. Reynaldo Minium, MD   Created for Patient: Clinical Summary    * Signed by Kathie Rhodes, M.D. on 01/20/17 at 2:34 PM (EDT)*     The information contained in this medical record document is considered private and confidential patient information. This information can only be used for the medical diagnosis and/or medical services that are being provided by the patient's selected caregivers. This information can only be distributed outside of the patient's care if the patient agrees and signs waivers of authorization for this information to be sent to an outside source or route.

## 2017-01-27 NOTE — Interval H&P Note (Signed)
History and Physical Interval Note:  01/27/2017 7:55 AM  Joanna Johnston  has presented today for surgery, with the diagnosis of RIGHT RENAL CALCULUS  The various methods of treatment have been discussed with the patient and family. After consideration of risks, benefits and other options for treatment, the patient has consented to  Procedure(s): EXTRACORPOREAL SHOCK WAVE LITHOTRIPSY (ESWL) (Right) as a surgical intervention .  The patient's history has been reviewed, patient examined, no change in status, stable for surgery.  I have reviewed the patient's chart and labs.  Questions were answered to the patient's satisfaction.     Bernestine Amass

## 2017-01-27 NOTE — Op Note (Signed)
See [facility] OP note scanned into chart. 

## 2017-01-30 DIAGNOSIS — R310 Gross hematuria: Secondary | ICD-10-CM | POA: Diagnosis not present

## 2017-02-11 DIAGNOSIS — N200 Calculus of kidney: Secondary | ICD-10-CM | POA: Diagnosis not present

## 2017-02-26 DIAGNOSIS — M47812 Spondylosis without myelopathy or radiculopathy, cervical region: Secondary | ICD-10-CM | POA: Diagnosis not present

## 2017-02-26 DIAGNOSIS — I129 Hypertensive chronic kidney disease with stage 1 through stage 4 chronic kidney disease, or unspecified chronic kidney disease: Secondary | ICD-10-CM | POA: Diagnosis not present

## 2017-02-26 DIAGNOSIS — N183 Chronic kidney disease, stage 3 (moderate): Secondary | ICD-10-CM | POA: Diagnosis not present

## 2017-02-26 DIAGNOSIS — I10 Essential (primary) hypertension: Secondary | ICD-10-CM | POA: Diagnosis not present

## 2017-02-26 DIAGNOSIS — E782 Mixed hyperlipidemia: Secondary | ICD-10-CM | POA: Diagnosis not present

## 2017-02-26 DIAGNOSIS — R7301 Impaired fasting glucose: Secondary | ICD-10-CM | POA: Diagnosis not present

## 2017-02-26 DIAGNOSIS — E038 Other specified hypothyroidism: Secondary | ICD-10-CM | POA: Diagnosis not present

## 2017-02-26 DIAGNOSIS — E669 Obesity, unspecified: Secondary | ICD-10-CM | POA: Diagnosis not present

## 2017-02-26 DIAGNOSIS — Z6830 Body mass index (BMI) 30.0-30.9, adult: Secondary | ICD-10-CM | POA: Diagnosis not present

## 2017-03-11 DIAGNOSIS — N200 Calculus of kidney: Secondary | ICD-10-CM | POA: Diagnosis not present

## 2017-05-08 DIAGNOSIS — E038 Other specified hypothyroidism: Secondary | ICD-10-CM | POA: Diagnosis not present

## 2017-05-08 DIAGNOSIS — R7302 Impaired glucose tolerance (oral): Secondary | ICD-10-CM | POA: Diagnosis not present

## 2017-05-08 DIAGNOSIS — R82998 Other abnormal findings in urine: Secondary | ICD-10-CM | POA: Diagnosis not present

## 2017-05-08 DIAGNOSIS — Z0000 Encounter for general adult medical examination without abnormal findings: Secondary | ICD-10-CM | POA: Diagnosis not present

## 2017-05-08 DIAGNOSIS — I10 Essential (primary) hypertension: Secondary | ICD-10-CM | POA: Diagnosis not present

## 2017-05-15 DIAGNOSIS — Z23 Encounter for immunization: Secondary | ICD-10-CM | POA: Diagnosis not present

## 2017-05-15 DIAGNOSIS — E038 Other specified hypothyroidism: Secondary | ICD-10-CM | POA: Diagnosis not present

## 2017-05-15 DIAGNOSIS — N183 Chronic kidney disease, stage 3 (moderate): Secondary | ICD-10-CM | POA: Diagnosis not present

## 2017-05-15 DIAGNOSIS — M47812 Spondylosis without myelopathy or radiculopathy, cervical region: Secondary | ICD-10-CM | POA: Diagnosis not present

## 2017-05-15 DIAGNOSIS — Z1389 Encounter for screening for other disorder: Secondary | ICD-10-CM | POA: Diagnosis not present

## 2017-05-15 DIAGNOSIS — Z8719 Personal history of other diseases of the digestive system: Secondary | ICD-10-CM | POA: Diagnosis not present

## 2017-05-15 DIAGNOSIS — I129 Hypertensive chronic kidney disease with stage 1 through stage 4 chronic kidney disease, or unspecified chronic kidney disease: Secondary | ICD-10-CM | POA: Diagnosis not present

## 2017-05-15 DIAGNOSIS — Z6830 Body mass index (BMI) 30.0-30.9, adult: Secondary | ICD-10-CM | POA: Diagnosis not present

## 2017-05-15 DIAGNOSIS — E669 Obesity, unspecified: Secondary | ICD-10-CM | POA: Diagnosis not present

## 2017-05-15 DIAGNOSIS — Z0000 Encounter for general adult medical examination without abnormal findings: Secondary | ICD-10-CM | POA: Diagnosis not present

## 2017-05-15 DIAGNOSIS — E782 Mixed hyperlipidemia: Secondary | ICD-10-CM | POA: Diagnosis not present

## 2017-05-15 DIAGNOSIS — I10 Essential (primary) hypertension: Secondary | ICD-10-CM | POA: Diagnosis not present

## 2017-08-28 DIAGNOSIS — N200 Calculus of kidney: Secondary | ICD-10-CM | POA: Diagnosis not present

## 2017-11-11 DIAGNOSIS — H10412 Chronic giant papillary conjunctivitis, left eye: Secondary | ICD-10-CM | POA: Diagnosis not present

## 2017-11-17 DIAGNOSIS — E038 Other specified hypothyroidism: Secondary | ICD-10-CM | POA: Diagnosis not present

## 2017-11-17 DIAGNOSIS — Z6830 Body mass index (BMI) 30.0-30.9, adult: Secondary | ICD-10-CM | POA: Diagnosis not present

## 2017-11-17 DIAGNOSIS — R7301 Impaired fasting glucose: Secondary | ICD-10-CM | POA: Diagnosis not present

## 2017-11-17 DIAGNOSIS — N183 Chronic kidney disease, stage 3 (moderate): Secondary | ICD-10-CM | POA: Diagnosis not present

## 2017-11-17 DIAGNOSIS — I129 Hypertensive chronic kidney disease with stage 1 through stage 4 chronic kidney disease, or unspecified chronic kidney disease: Secondary | ICD-10-CM | POA: Diagnosis not present

## 2017-11-17 DIAGNOSIS — Z1389 Encounter for screening for other disorder: Secondary | ICD-10-CM | POA: Diagnosis not present

## 2017-11-17 DIAGNOSIS — E782 Mixed hyperlipidemia: Secondary | ICD-10-CM | POA: Diagnosis not present

## 2017-11-17 DIAGNOSIS — M47812 Spondylosis without myelopathy or radiculopathy, cervical region: Secondary | ICD-10-CM | POA: Diagnosis not present

## 2017-11-17 DIAGNOSIS — I10 Essential (primary) hypertension: Secondary | ICD-10-CM | POA: Diagnosis not present

## 2017-11-17 DIAGNOSIS — E669 Obesity, unspecified: Secondary | ICD-10-CM | POA: Diagnosis not present

## 2018-01-28 DIAGNOSIS — Z1231 Encounter for screening mammogram for malignant neoplasm of breast: Secondary | ICD-10-CM | POA: Diagnosis not present

## 2018-04-14 ENCOUNTER — Other Ambulatory Visit: Payer: Self-pay | Admitting: Gastroenterology

## 2018-04-14 DIAGNOSIS — Z8379 Family history of other diseases of the digestive system: Secondary | ICD-10-CM | POA: Diagnosis not present

## 2018-04-14 DIAGNOSIS — R1013 Epigastric pain: Secondary | ICD-10-CM

## 2018-04-16 DIAGNOSIS — Z23 Encounter for immunization: Secondary | ICD-10-CM | POA: Diagnosis not present

## 2018-04-23 ENCOUNTER — Ambulatory Visit
Admission: RE | Admit: 2018-04-23 | Discharge: 2018-04-23 | Disposition: A | Discharge status: Home / Self Care | Payer: Medicare Other | Source: Ambulatory Visit | Attending: Gastroenterology | Admitting: Gastroenterology

## 2018-04-23 DIAGNOSIS — N281 Cyst of kidney, acquired: Secondary | ICD-10-CM | POA: Diagnosis not present

## 2018-04-23 DIAGNOSIS — R1013 Epigastric pain: Secondary | ICD-10-CM

## 2018-05-05 DIAGNOSIS — R1010 Upper abdominal pain, unspecified: Secondary | ICD-10-CM | POA: Diagnosis not present

## 2018-05-05 DIAGNOSIS — R1030 Lower abdominal pain, unspecified: Secondary | ICD-10-CM | POA: Diagnosis not present

## 2018-05-05 DIAGNOSIS — K625 Hemorrhage of anus and rectum: Secondary | ICD-10-CM | POA: Diagnosis not present

## 2018-05-14 DIAGNOSIS — E038 Other specified hypothyroidism: Secondary | ICD-10-CM | POA: Diagnosis not present

## 2018-05-14 DIAGNOSIS — R82998 Other abnormal findings in urine: Secondary | ICD-10-CM | POA: Diagnosis not present

## 2018-05-14 DIAGNOSIS — I10 Essential (primary) hypertension: Secondary | ICD-10-CM | POA: Diagnosis not present

## 2018-05-14 DIAGNOSIS — R7301 Impaired fasting glucose: Secondary | ICD-10-CM | POA: Diagnosis not present

## 2018-05-14 DIAGNOSIS — E782 Mixed hyperlipidemia: Secondary | ICD-10-CM | POA: Diagnosis not present

## 2018-05-18 DIAGNOSIS — I10 Essential (primary) hypertension: Secondary | ICD-10-CM | POA: Diagnosis not present

## 2018-05-18 DIAGNOSIS — E782 Mixed hyperlipidemia: Secondary | ICD-10-CM | POA: Diagnosis not present

## 2018-05-18 DIAGNOSIS — Z8719 Personal history of other diseases of the digestive system: Secondary | ICD-10-CM | POA: Diagnosis not present

## 2018-05-18 DIAGNOSIS — R7301 Impaired fasting glucose: Secondary | ICD-10-CM | POA: Diagnosis not present

## 2018-05-18 DIAGNOSIS — M47812 Spondylosis without myelopathy or radiculopathy, cervical region: Secondary | ICD-10-CM | POA: Diagnosis not present

## 2018-05-18 DIAGNOSIS — E038 Other specified hypothyroidism: Secondary | ICD-10-CM | POA: Diagnosis not present

## 2018-05-18 DIAGNOSIS — N183 Chronic kidney disease, stage 3 (moderate): Secondary | ICD-10-CM | POA: Diagnosis not present

## 2018-05-18 DIAGNOSIS — E669 Obesity, unspecified: Secondary | ICD-10-CM | POA: Diagnosis not present

## 2018-05-18 DIAGNOSIS — I129 Hypertensive chronic kidney disease with stage 1 through stage 4 chronic kidney disease, or unspecified chronic kidney disease: Secondary | ICD-10-CM | POA: Diagnosis not present

## 2018-05-18 DIAGNOSIS — Z0000 Encounter for general adult medical examination without abnormal findings: Secondary | ICD-10-CM | POA: Diagnosis not present

## 2018-05-18 DIAGNOSIS — Z6830 Body mass index (BMI) 30.0-30.9, adult: Secondary | ICD-10-CM | POA: Diagnosis not present

## 2018-05-22 DIAGNOSIS — R079 Chest pain, unspecified: Secondary | ICD-10-CM | POA: Diagnosis not present

## 2018-05-22 DIAGNOSIS — R0789 Other chest pain: Secondary | ICD-10-CM | POA: Diagnosis not present

## 2018-05-22 DIAGNOSIS — R55 Syncope and collapse: Secondary | ICD-10-CM | POA: Diagnosis not present

## 2018-06-17 DIAGNOSIS — H903 Sensorineural hearing loss, bilateral: Secondary | ICD-10-CM | POA: Diagnosis not present

## 2018-06-17 DIAGNOSIS — H6121 Impacted cerumen, right ear: Secondary | ICD-10-CM | POA: Diagnosis not present

## 2018-06-18 DIAGNOSIS — L820 Inflamed seborrheic keratosis: Secondary | ICD-10-CM | POA: Diagnosis not present

## 2018-06-18 DIAGNOSIS — B078 Other viral warts: Secondary | ICD-10-CM | POA: Diagnosis not present

## 2018-06-18 DIAGNOSIS — L821 Other seborrheic keratosis: Secondary | ICD-10-CM | POA: Diagnosis not present

## 2018-08-10 DIAGNOSIS — R1013 Epigastric pain: Secondary | ICD-10-CM | POA: Diagnosis not present

## 2018-08-31 DIAGNOSIS — N200 Calculus of kidney: Secondary | ICD-10-CM | POA: Diagnosis not present

## 2018-11-23 DIAGNOSIS — Z1331 Encounter for screening for depression: Secondary | ICD-10-CM | POA: Diagnosis not present

## 2018-11-23 DIAGNOSIS — E782 Mixed hyperlipidemia: Secondary | ICD-10-CM | POA: Diagnosis not present

## 2018-11-23 DIAGNOSIS — R7301 Impaired fasting glucose: Secondary | ICD-10-CM | POA: Diagnosis not present

## 2018-11-23 DIAGNOSIS — N183 Chronic kidney disease, stage 3 (moderate): Secondary | ICD-10-CM | POA: Diagnosis not present

## 2018-11-23 DIAGNOSIS — E039 Hypothyroidism, unspecified: Secondary | ICD-10-CM | POA: Diagnosis not present

## 2018-11-23 DIAGNOSIS — Z1339 Encounter for screening examination for other mental health and behavioral disorders: Secondary | ICD-10-CM | POA: Diagnosis not present

## 2018-11-23 DIAGNOSIS — M47812 Spondylosis without myelopathy or radiculopathy, cervical region: Secondary | ICD-10-CM | POA: Diagnosis not present

## 2018-11-23 DIAGNOSIS — I10 Essential (primary) hypertension: Secondary | ICD-10-CM | POA: Diagnosis not present

## 2018-11-23 DIAGNOSIS — Z8719 Personal history of other diseases of the digestive system: Secondary | ICD-10-CM | POA: Diagnosis not present

## 2018-11-23 DIAGNOSIS — E669 Obesity, unspecified: Secondary | ICD-10-CM | POA: Diagnosis not present

## 2019-01-22 ENCOUNTER — Other Ambulatory Visit: Payer: Self-pay | Admitting: Internal Medicine

## 2019-01-22 DIAGNOSIS — R55 Syncope and collapse: Secondary | ICD-10-CM

## 2019-02-10 DIAGNOSIS — I129 Hypertensive chronic kidney disease with stage 1 through stage 4 chronic kidney disease, or unspecified chronic kidney disease: Secondary | ICD-10-CM | POA: Diagnosis not present

## 2019-02-10 DIAGNOSIS — N183 Chronic kidney disease, stage 3 (moderate): Secondary | ICD-10-CM | POA: Diagnosis not present

## 2019-02-10 DIAGNOSIS — R55 Syncope and collapse: Secondary | ICD-10-CM | POA: Diagnosis not present

## 2019-02-10 DIAGNOSIS — E782 Mixed hyperlipidemia: Secondary | ICD-10-CM | POA: Diagnosis not present

## 2019-02-19 DIAGNOSIS — R1310 Dysphagia, unspecified: Secondary | ICD-10-CM | POA: Diagnosis not present

## 2019-02-19 DIAGNOSIS — R935 Abnormal findings on diagnostic imaging of other abdominal regions, including retroperitoneum: Secondary | ICD-10-CM | POA: Diagnosis not present

## 2019-02-19 DIAGNOSIS — K219 Gastro-esophageal reflux disease without esophagitis: Secondary | ICD-10-CM | POA: Diagnosis not present

## 2019-02-22 ENCOUNTER — Other Ambulatory Visit: Payer: Self-pay | Admitting: Gastroenterology

## 2019-02-22 DIAGNOSIS — R1319 Other dysphagia: Secondary | ICD-10-CM

## 2019-02-22 DIAGNOSIS — R1310 Dysphagia, unspecified: Secondary | ICD-10-CM

## 2019-03-03 ENCOUNTER — Ambulatory Visit
Admission: RE | Admit: 2019-03-03 | Discharge: 2019-03-03 | Disposition: A | Discharge status: Home / Self Care | Payer: Medicare Other | Source: Ambulatory Visit | Attending: Gastroenterology | Admitting: Gastroenterology

## 2019-03-03 DIAGNOSIS — R1310 Dysphagia, unspecified: Secondary | ICD-10-CM

## 2019-03-03 DIAGNOSIS — K449 Diaphragmatic hernia without obstruction or gangrene: Secondary | ICD-10-CM | POA: Diagnosis not present

## 2019-03-03 DIAGNOSIS — R1319 Other dysphagia: Secondary | ICD-10-CM

## 2019-03-04 DIAGNOSIS — K219 Gastro-esophageal reflux disease without esophagitis: Secondary | ICD-10-CM | POA: Diagnosis not present

## 2019-03-04 DIAGNOSIS — R1310 Dysphagia, unspecified: Secondary | ICD-10-CM | POA: Diagnosis not present

## 2019-03-04 DIAGNOSIS — R1013 Epigastric pain: Secondary | ICD-10-CM | POA: Diagnosis not present

## 2019-03-18 ENCOUNTER — Other Ambulatory Visit: Payer: Self-pay

## 2019-03-18 ENCOUNTER — Emergency Department (HOSPITAL_BASED_OUTPATIENT_CLINIC_OR_DEPARTMENT_OTHER): Payer: Medicare Other

## 2019-03-18 ENCOUNTER — Encounter (HOSPITAL_BASED_OUTPATIENT_CLINIC_OR_DEPARTMENT_OTHER): Payer: Self-pay | Admitting: Adult Health

## 2019-03-18 ENCOUNTER — Inpatient Hospital Stay (HOSPITAL_BASED_OUTPATIENT_CLINIC_OR_DEPARTMENT_OTHER)
Admission: EM | Admit: 2019-03-18 | Discharge: 2019-03-20 | DRG: 379 | Disposition: A | Discharge status: Home / Self Care | Payer: Medicare Other | Attending: Family Medicine | Admitting: Family Medicine

## 2019-03-18 DIAGNOSIS — Z885 Allergy status to narcotic agent status: Secondary | ICD-10-CM | POA: Diagnosis not present

## 2019-03-18 DIAGNOSIS — Z87440 Personal history of urinary (tract) infections: Secondary | ICD-10-CM | POA: Diagnosis not present

## 2019-03-18 DIAGNOSIS — Z90710 Acquired absence of both cervix and uterus: Secondary | ICD-10-CM

## 2019-03-18 DIAGNOSIS — E039 Hypothyroidism, unspecified: Secondary | ICD-10-CM | POA: Diagnosis present

## 2019-03-18 DIAGNOSIS — E785 Hyperlipidemia, unspecified: Secondary | ICD-10-CM | POA: Diagnosis not present

## 2019-03-18 DIAGNOSIS — K922 Gastrointestinal hemorrhage, unspecified: Secondary | ICD-10-CM | POA: Diagnosis not present

## 2019-03-18 DIAGNOSIS — E890 Postprocedural hypothyroidism: Secondary | ICD-10-CM | POA: Diagnosis present

## 2019-03-18 DIAGNOSIS — Z7901 Long term (current) use of anticoagulants: Secondary | ICD-10-CM

## 2019-03-18 DIAGNOSIS — Z800 Family history of malignant neoplasm of digestive organs: Secondary | ICD-10-CM | POA: Diagnosis not present

## 2019-03-18 DIAGNOSIS — Z79890 Hormone replacement therapy (postmenopausal): Secondary | ICD-10-CM

## 2019-03-18 DIAGNOSIS — K5791 Diverticulosis of intestine, part unspecified, without perforation or abscess with bleeding: Secondary | ICD-10-CM | POA: Diagnosis not present

## 2019-03-18 DIAGNOSIS — N200 Calculus of kidney: Secondary | ICD-10-CM | POA: Diagnosis not present

## 2019-03-18 DIAGNOSIS — Z79899 Other long term (current) drug therapy: Secondary | ICD-10-CM | POA: Diagnosis not present

## 2019-03-18 DIAGNOSIS — Z03818 Encounter for observation for suspected exposure to other biological agents ruled out: Secondary | ICD-10-CM | POA: Diagnosis not present

## 2019-03-18 DIAGNOSIS — K219 Gastro-esophageal reflux disease without esophagitis: Secondary | ICD-10-CM | POA: Diagnosis present

## 2019-03-18 DIAGNOSIS — K5730 Diverticulosis of large intestine without perforation or abscess without bleeding: Secondary | ICD-10-CM | POA: Diagnosis not present

## 2019-03-18 DIAGNOSIS — Z20828 Contact with and (suspected) exposure to other viral communicable diseases: Secondary | ICD-10-CM | POA: Diagnosis not present

## 2019-03-18 DIAGNOSIS — Z87442 Personal history of urinary calculi: Secondary | ICD-10-CM

## 2019-03-18 DIAGNOSIS — K625 Hemorrhage of anus and rectum: Secondary | ICD-10-CM | POA: Diagnosis present

## 2019-03-18 HISTORY — DX: Gastro-esophageal reflux disease without esophagitis: K21.9

## 2019-03-18 HISTORY — DX: Diverticulosis of intestine, part unspecified, without perforation or abscess without bleeding: K57.90

## 2019-03-18 LAB — CBC
HCT: 39 % (ref 36.0–46.0)
Hemoglobin: 12.8 g/dL (ref 12.0–15.0)
MCH: 29.2 pg (ref 26.0–34.0)
MCHC: 32.8 g/dL (ref 30.0–36.0)
MCV: 88.8 fL (ref 80.0–100.0)
Platelets: 233 10*3/uL (ref 150–400)
RBC: 4.39 MIL/uL (ref 3.87–5.11)
RDW: 13.6 % (ref 11.5–15.5)
WBC: 10.6 10*3/uL — ABNORMAL HIGH (ref 4.0–10.5)
nRBC: 0 % (ref 0.0–0.2)

## 2019-03-18 LAB — COMPREHENSIVE METABOLIC PANEL
ALT: 21 U/L (ref 0–44)
AST: 28 U/L (ref 15–41)
Albumin: 4.6 g/dL (ref 3.5–5.0)
Alkaline Phosphatase: 76 U/L (ref 38–126)
Anion gap: 14 (ref 5–15)
BUN: 25 mg/dL — ABNORMAL HIGH (ref 8–23)
CO2: 23 mmol/L (ref 22–32)
Calcium: 9.4 mg/dL (ref 8.9–10.3)
Chloride: 101 mmol/L (ref 98–111)
Creatinine, Ser: 1.18 mg/dL — ABNORMAL HIGH (ref 0.44–1.00)
GFR calc Af Amer: 52 mL/min — ABNORMAL LOW (ref >60)
GFR calc non Af Amer: 45 mL/min — ABNORMAL LOW (ref >60)
Glucose, Bld: 128 mg/dL — ABNORMAL HIGH (ref 70–99)
Potassium: 3.9 mmol/L (ref 3.5–5.1)
Sodium: 138 mmol/L (ref 135–145)
Total Bilirubin: 0.6 mg/dL (ref 0.3–1.2)
Total Protein: 7.8 g/dL (ref 6.5–8.1)

## 2019-03-18 LAB — SARS CORONAVIRUS 2 BY RT PCR (HOSPITAL ORDER, PERFORMED IN ~~LOC~~ HOSPITAL LAB): SARS Coronavirus 2: NEGATIVE

## 2019-03-18 MED ORDER — IOHEXOL 300 MG/ML  SOLN
100.0000 mL | Freq: Once | INTRAMUSCULAR | Status: AC | PRN
  Administered 2019-03-18: 100 mL via INTRAVENOUS

## 2019-03-18 MED ORDER — SODIUM CHLORIDE 0.9 % IV SOLN
INTRAVENOUS | Status: DC
Start: 2019-03-18 — End: 2019-03-19
  Administered 2019-03-19: 01:00:00 via INTRAVENOUS

## 2019-03-18 NOTE — ED Notes (Signed)
MD at bedside. 

## 2019-03-18 NOTE — ED Provider Notes (Signed)
[facility] EMERGENCY DEPARTMENT Provider Note   CSN: [id number] Arrival date & time: 03/18/19  1911     History   Chief Complaint Chief Complaint  Patient presents with  . GI Bleeding    HPI Joanna Johnston is a 78 y.o. female.     Patient with 2-3 dark maroon-colored stools since Sunday.  Toilet water has stained red with each 1 of those.  Patient has a history of diverticulosis she contacted her GI doctor's office Dr. Cristina Gong and he recommended that she come in and probably would require admission.  She denies fatigue shortness of breath chest pain or lightheadedness.  Last bloody bowel movement was today at 7 PM.  She has been taking aspirin but stopped it taking it on Sunday.  She does have right lower quadrant abdominal pain with this.  No vomiting.  No fevers.     Past Medical History:  Diagnosis Date  . Diverticulosis   . GERD (gastroesophageal reflux disease)   . High cholesterol   . History of kidney stones   . History of urinary tract infection   . Hypothyroidism   . Sepsis ([place])    approx 6 years ago     There are no active problems to display for this patient.   Past Surgical History:  Procedure Laterality Date  . ABDOMINAL HYSTERECTOMY    . BUNIONECTOMY Right   . EXTRACORPOREAL SHOCK WAVE LITHOTRIPSY Right 01/27/2017   Procedure: EXTRACORPOREAL SHOCK WAVE LITHOTRIPSY (ESWL);  Surgeon: Rana Snare, MD;  Location: [facility];  Service: Urology;  Laterality: Right;  . THYROIDECTOMY Left      OB History   No obstetric history on file.      Home Medications    Prior to Admission medications   Medication Sig Start Date End Date Taking? Authorizing Provider  Biotin 5000 MCG TABS Take by mouth daily.   Yes [provider]  levothyroxine (SYNTHROID, LEVOTHROID) 75 MCG tablet Take 75 mcg by mouth daily.   Yes [provider]  nitrofurantoin (MACRODANTIN) 100 MG capsule Take 100 mg by mouth daily.   Yes [provider]   pantoprazole (PROTONIX) 40 MG tablet Take 40 mg by mouth 2 (two) times daily.   Yes [provider]  rosuvastatin (CRESTOR) 20 MG tablet Take 20 mg by mouth daily.   Yes [provider]  HYDROcodone-acetaminophen (NORCO/VICODIN) 5-325 MG tablet Take 1-2 tablets by mouth every 6 (six) hours as needed. 01/27/17   Rana Snare, MD    Family History History reviewed. No pertinent family history.  Social History Social History   Tobacco Use  . Smoking status: Never Smoker  . Smokeless tobacco: Never Used  Substance Use Topics  . Alcohol use: Yes    Alcohol/week: 7.0 standard drinks    Types: 7 Glasses of wine per week    Comment: glass of wine daily   . Drug use: No     Allergies   Codeine   Review of Systems Review of Systems  Constitutional: Negative for chills and fever.  HENT: Negative for congestion, rhinorrhea and sore throat.   Eyes: Negative for visual disturbance.  Respiratory: Negative for cough and shortness of breath.   Cardiovascular: Negative for chest pain and leg swelling.  Gastrointestinal: Positive for abdominal pain and blood in stool. Negative for diarrhea, nausea and vomiting.  Genitourinary: Negative for dysuria.  Musculoskeletal: Negative for back pain and neck pain.  Skin: Negative for rash.  Neurological: Negative for dizziness, light-headedness  and headaches.  Hematological: Does not bruise/bleed easily.  Psychiatric/Behavioral: Negative for confusion.     Physical Exam Updated Vital Signs BP (!) 185/107   Pulse 100   Temp 98.8 F (37.1 C) (Oral)   Resp 16   Ht 1.676 m (5\' 6" )   Wt 81.6 kg   SpO2 99%   BMI 29.05 kg/m   Physical Exam Vitals signs and nursing note reviewed.  Constitutional:      General: She is not in acute distress.    Appearance: Normal appearance. She is well-developed.  HENT:     Head: Normocephalic and atraumatic.  Eyes:     Extraocular Movements: Extraocular movements intact.      Conjunctiva/sclera: Conjunctivae normal.     Pupils: Pupils are equal, round, and reactive to light.  Neck:     Musculoskeletal: Normal range of motion and neck supple.  Cardiovascular:     Rate and Rhythm: Normal rate and regular rhythm.     Heart sounds: No murmur.  Pulmonary:     Effort: Pulmonary effort is normal. No respiratory distress.     Breath sounds: Normal breath sounds.  Abdominal:     Palpations: Abdomen is soft.     Tenderness: There is no abdominal tenderness.  Genitourinary:    Rectum: Guaiac result positive.  Musculoskeletal: Normal range of motion.  Skin:    General: Skin is warm and dry.  Neurological:     General: No focal deficit present.     Mental Status: She is alert and oriented to person, place, and time.      ED Treatments / Results  Labs (all labs ordered are listed, but only abnormal results are displayed) Labs Reviewed  COMPREHENSIVE METABOLIC PANEL - Abnormal; Notable for the following components:      Result Value   Glucose, Bld 128 (*)    BUN 25 (*)    Creatinine, Ser 1.18 (*)    GFR calc non Af Amer 45 (*)    GFR calc Af Amer 52 (*)    All other components within normal limits  CBC - Abnormal; Notable for the following components:   WBC 10.6 (*)    All other components within normal limits  SARS CORONAVIRUS 2 (HOSPITAL ORDER, [facility])  POC OCCULT BLOOD, ED  POC OCCULT BLOOD, ED    EKG None  Radiology Ct Abdomen Pelvis W Contrast  Result Date: 03/18/2019 CLINICAL DATA:  Abdominal pain with diverticulitis suspected. Maroon stool. EXAM: CT ABDOMEN AND PELVIS WITH CONTRAST TECHNIQUE: Multidetector CT imaging of the abdomen and pelvis was performed using the standard protocol following bolus administration of intravenous contrast. CONTRAST:  173mL OMNIPAQUE IOHEXOL 300 MG/ML  SOLN COMPARISON:  CT dated August 09, 2014 FINDINGS: Lower chest: The lung bases are clear. The heart size is normal. Hepatobiliary:  Again noted are scattered hypoattenuating areas in the liver. Normal gallbladder.There is no biliary ductal dilation. Pancreas: There is a stable 6 mm hypoattenuating nodule rising from the pancreatic tail. Spleen: No splenic laceration or hematoma. Adrenals/Urinary Tract: --Adrenal glands: No adrenal hemorrhage. --Right kidney/ureter: No hydronephrosis or perinephric hematoma. --Left kidney/ureter: There are nonobstructing stones in the lower pole measuring up to approximately 6 mm. --Urinary bladder: Unremarkable. Stomach/Bowel: --Stomach/Duodenum: There is a small hiatal hernia. --Small bowel: No dilatation or inflammation. --Colon: Rectosigmoid diverticulosis without acute inflammation. --Appendix: Not visualized. No right lower quadrant inflammation or free fluid. Vascular/Lymphatic: Atherosclerotic calcification is present within the non-aneurysmal abdominal aorta, without hemodynamically  significant stenosis. --No retroperitoneal lymphadenopathy. --No mesenteric lymphadenopathy. --No pelvic or inguinal lymphadenopathy. Reproductive: Normal uterus and ovaries. Other: No ascites or free air. The abdominal wall is normal. Musculoskeletal. There is a large lipoma in the right gluteal region. There is no displaced fracture. IMPRESSION: 1. Diverticulosis without CT evidence for diverticulitis. 2. Nonobstructing left-sided nephrolithiasis. Electronically Signed   By: Constance Holster M.D.   On: 03/18/2019 21:51    Procedures Procedures (including critical care time)  Medications Ordered in ED Medications  0.9 %  sodium chloride infusion (has no administration in time range)  iohexol (OMNIPAQUE) 300 MG/ML solution 100 mL (100 mLs Intravenous Contrast Given 03/18/19 2124)     Initial Impression / Assessment and Plan / ED Course  I have reviewed the triage vital signs and the nursing notes.  Pertinent labs & imaging results that were available during my care of the patient were reviewed by me and  considered in my medical decision making (see chart for details).        Discussed with hospitalist they will admit for the GI bleed.  Patient without a significant anemia.  Hemoglobin very stable.  But she is slightly tachycardic with heart rates in low 100s or right around 100.  Blood pressure is fine.  Dr. Janeece Riggers is her gastroenterologist in the past.  CT scan showed no acute findings.  Did show evidence of diverticulosis which may very well be the cause of the bleeding.  No evidence of diverticulitis.  Hospitalist will admit patient will be transferred to [facility] long for admission.  Final Clinical Impressions(s) / ED Diagnoses   Final diagnoses:  Gastrointestinal hemorrhage, unspecified gastrointestinal hemorrhage type    ED Discharge Orders    None       Fredia Sorrow, MD 03/18/19 2253

## 2019-03-18 NOTE — ED Triage Notes (Addendum)
PResents with 2-3 daily dark maroon colored stools since Sunday. She has a HX of Diverticulosis and her GI doctor is [facility]. She denies fatigue, SOB and dizziness. LAst Stool was at 7 pm today. Denies fevers. She has been taking ASA, but stopped taking it Sunday.  She endorses RLQ pain.

## 2019-03-18 NOTE — ED Notes (Signed)
No episodes of lightheadedness upon standing during orthostatic V/S.

## 2019-03-19 ENCOUNTER — Encounter (HOSPITAL_COMMUNITY): Payer: Self-pay | Admitting: Internal Medicine

## 2019-03-19 DIAGNOSIS — Z87440 Personal history of urinary (tract) infections: Secondary | ICD-10-CM | POA: Diagnosis not present

## 2019-03-19 DIAGNOSIS — E890 Postprocedural hypothyroidism: Secondary | ICD-10-CM | POA: Diagnosis present

## 2019-03-19 DIAGNOSIS — K625 Hemorrhage of anus and rectum: Secondary | ICD-10-CM | POA: Diagnosis not present

## 2019-03-19 DIAGNOSIS — Z885 Allergy status to narcotic agent status: Secondary | ICD-10-CM | POA: Diagnosis not present

## 2019-03-19 DIAGNOSIS — Z800 Family history of malignant neoplasm of digestive organs: Secondary | ICD-10-CM | POA: Diagnosis not present

## 2019-03-19 DIAGNOSIS — E039 Hypothyroidism, unspecified: Secondary | ICD-10-CM | POA: Diagnosis not present

## 2019-03-19 DIAGNOSIS — Z79899 Other long term (current) drug therapy: Secondary | ICD-10-CM | POA: Diagnosis not present

## 2019-03-19 DIAGNOSIS — K5791 Diverticulosis of intestine, part unspecified, without perforation or abscess with bleeding: Secondary | ICD-10-CM | POA: Diagnosis present

## 2019-03-19 DIAGNOSIS — Z79890 Hormone replacement therapy (postmenopausal): Secondary | ICD-10-CM | POA: Diagnosis not present

## 2019-03-19 DIAGNOSIS — Z20828 Contact with and (suspected) exposure to other viral communicable diseases: Secondary | ICD-10-CM | POA: Diagnosis present

## 2019-03-19 DIAGNOSIS — K219 Gastro-esophageal reflux disease without esophagitis: Secondary | ICD-10-CM | POA: Diagnosis present

## 2019-03-19 DIAGNOSIS — Z87442 Personal history of urinary calculi: Secondary | ICD-10-CM | POA: Diagnosis not present

## 2019-03-19 DIAGNOSIS — K922 Gastrointestinal hemorrhage, unspecified: Secondary | ICD-10-CM | POA: Diagnosis not present

## 2019-03-19 DIAGNOSIS — E785 Hyperlipidemia, unspecified: Secondary | ICD-10-CM | POA: Diagnosis present

## 2019-03-19 DIAGNOSIS — Z7901 Long term (current) use of anticoagulants: Secondary | ICD-10-CM | POA: Diagnosis not present

## 2019-03-19 DIAGNOSIS — Z90710 Acquired absence of both cervix and uterus: Secondary | ICD-10-CM | POA: Diagnosis not present

## 2019-03-19 DIAGNOSIS — K921 Melena: Secondary | ICD-10-CM | POA: Diagnosis not present

## 2019-03-19 LAB — CBC
HCT: 36.5 % (ref 36.0–46.0)
HCT: 36.3 % (ref 36.0–46.0)
HCT: 35.3 % — ABNORMAL LOW (ref 36.0–46.0)
HCT: 36.5 % (ref 36.0–46.0)
Hemoglobin: 12 g/dL (ref 12.0–15.0)
Hemoglobin: 11.9 g/dL — ABNORMAL LOW (ref 12.0–15.0)
Hemoglobin: 11.5 g/dL — ABNORMAL LOW (ref 12.0–15.0)
Hemoglobin: 11.8 g/dL — ABNORMAL LOW (ref 12.0–15.0)
MCH: 29.4 pg (ref 26.0–34.0)
MCH: 29.8 pg (ref 26.0–34.0)
MCH: 29.1 pg (ref 26.0–34.0)
MCH: 29.4 pg (ref 26.0–34.0)
MCHC: 32.9 g/dL (ref 30.0–36.0)
MCHC: 32.8 g/dL (ref 30.0–36.0)
MCHC: 32.6 g/dL (ref 30.0–36.0)
MCHC: 32.3 g/dL (ref 30.0–36.0)
MCV: 89.5 fL (ref 80.0–100.0)
MCV: 90.8 fL (ref 80.0–100.0)
MCV: 89.4 fL (ref 80.0–100.0)
MCV: 90.8 fL (ref 80.0–100.0)
Platelets: 210 10*3/uL (ref 150–400)
Platelets: 186 10*3/uL (ref 150–400)
Platelets: 197 10*3/uL (ref 150–400)
Platelets: 200 10*3/uL (ref 150–400)
RBC: 4.08 MIL/uL (ref 3.87–5.11)
RBC: 4 MIL/uL (ref 3.87–5.11)
RBC: 3.95 MIL/uL (ref 3.87–5.11)
RBC: 4.02 MIL/uL (ref 3.87–5.11)
RDW: 13.6 % (ref 11.5–15.5)
RDW: 13.6 % (ref 11.5–15.5)
RDW: 13.6 % (ref 11.5–15.5)
RDW: 13.7 % (ref 11.5–15.5)
WBC: 7.4 10*3/uL (ref 4.0–10.5)
WBC: 8 10*3/uL (ref 4.0–10.5)
WBC: 6.3 10*3/uL (ref 4.0–10.5)
WBC: 7.4 10*3/uL (ref 4.0–10.5)
nRBC: 0 % (ref 0.0–0.2)
nRBC: 0 % (ref 0.0–0.2)
nRBC: 0 % (ref 0.0–0.2)
nRBC: 0 % (ref 0.0–0.2)

## 2019-03-19 LAB — ABO/RH: ABO/RH(D): A POS

## 2019-03-19 LAB — BASIC METABOLIC PANEL
Anion gap: 9 (ref 5–15)
BUN: 20 mg/dL (ref 8–23)
CO2: 24 mmol/L (ref 22–32)
Calcium: 9 mg/dL (ref 8.9–10.3)
Chloride: 107 mmol/L (ref 98–111)
Creatinine, Ser: 1 mg/dL (ref 0.44–1.00)
GFR calc Af Amer: >60 mL/min (ref >60)
GFR calc non Af Amer: 55 mL/min — ABNORMAL LOW (ref >60)
Glucose, Bld: 119 mg/dL — ABNORMAL HIGH (ref 70–99)
Potassium: 3.4 mmol/L — ABNORMAL LOW (ref 3.5–5.1)
Sodium: 140 mmol/L (ref 135–145)

## 2019-03-19 LAB — TYPE AND SCREEN
ABO/RH(D): A POS
Antibody Screen: NEGATIVE

## 2019-03-19 MED ORDER — ROSUVASTATIN CALCIUM 20 MG PO TABS: 20.0000 mg | ORAL_TABLET | Freq: Every day | ORAL | Status: DC

## 2019-03-19 MED ORDER — POLYETHYLENE GLYCOL 3350 17 G PO PACK
17.0000 g | PACK | Freq: Two times a day (BID) | ORAL | Status: DC
  Administered 2019-03-19 – 2019-03-20 (×3): 17 g via ORAL
  Filled 2019-03-19 (×3): qty 1

## 2019-03-19 MED ORDER — NITROFURANTOIN MACROCRYSTAL 100 MG PO CAPS
100.0000 mg | ORAL_CAPSULE | Freq: Every day | ORAL | Status: DC
  Administered 2019-03-19: 100 mg via ORAL
  Filled 2019-03-19 (×2): qty 1

## 2019-03-19 MED ORDER — ONDANSETRON HCL 4 MG/2ML IJ SOLN: 4.0000 mg | Freq: Four times a day (QID) | INTRAMUSCULAR | Status: DC | PRN

## 2019-03-19 MED ORDER — ONDANSETRON HCL 4 MG PO TABS: 4.0000 mg | ORAL_TABLET | Freq: Four times a day (QID) | ORAL | Status: DC | PRN

## 2019-03-19 MED ORDER — ACETAMINOPHEN 325 MG PO TABS: 650.0000 mg | ORAL_TABLET | Freq: Four times a day (QID) | ORAL | Status: DC | PRN

## 2019-03-19 MED ORDER — PANTOPRAZOLE SODIUM 40 MG PO TBEC
40.0000 mg | DELAYED_RELEASE_TABLET | Freq: Two times a day (BID) | ORAL | Status: DC
  Administered 2019-03-19 – 2019-03-20 (×3): 40 mg via ORAL
  Filled 2019-03-19 (×3): qty 1

## 2019-03-19 MED ORDER — SODIUM CHLORIDE 0.9 % IV SOLN
INTRAVENOUS | Status: DC
  Administered 2019-03-19: 04:00:00 via INTRAVENOUS

## 2019-03-19 MED ORDER — ACETAMINOPHEN 650 MG RE SUPP: 650.0000 mg | Freq: Four times a day (QID) | RECTAL | Status: DC | PRN

## 2019-03-19 MED ORDER — LEVOTHYROXINE SODIUM 75 MCG PO TABS
75.0000 ug | ORAL_TABLET | Freq: Every day | ORAL | Status: DC
  Administered 2019-03-19 – 2019-03-20 (×2): 75 ug via ORAL
  Filled 2019-03-19 (×2): qty 1

## 2019-03-19 MED ORDER — DIPHENHYDRAMINE HCL 25 MG PO CAPS
25.0000 mg | ORAL_CAPSULE | Freq: Once | ORAL | Status: AC
  Administered 2019-03-19: 25 mg via ORAL
  Filled 2019-03-19: qty 1

## 2019-03-19 MED ORDER — NITROFURANTOIN MONOHYD MACRO 100 MG PO CAPS
100.0000 mg | ORAL_CAPSULE | Freq: Once | ORAL | Status: AC
  Administered 2019-03-19: 100 mg via ORAL
  Filled 2019-03-19: qty 1

## 2019-03-19 NOTE — Progress Notes (Signed)
GI preliminary note:  I spoke with Dr. Lonny Prude about this patient, whom I follow in the office.  It does sound like this is a diverticular bleed, and I would treat her with typical supportive care and observation as is customary for that diagnosis.  I have further recommended MiraLAX to help flush blood out of the colon.  At this time, I do not think that upper endoscopy or a bleeding scan are necessary, but we will reconsider that as time goes on according to the patient's clinical evolution.  In the meantime, I think a clear liquid diet would be acceptable.  In the event that we had to do urgent endoscopy or prep the patient for colonoscopy, clear liquids would be preferable compared to having the patient on a solid diet.  I am available at any time if the patient's condition should change abruptly; otherwise, I will see her either later today or tomorrow morning.  Cleotis Nipper, M.D. Pager [phone] If no answer or after 5 PM call [phone]

## 2019-03-19 NOTE — Care Management Obs Status (Signed)
[facility] NOTIFICATION   Patient Details  Name: Joanna Johnston MRN: [id number] Date of Birth: 28-Sep-1940   Medicare Observation Status Notification Given:  Yes    Leeroy Cha, RN 03/19/2019, 12:39 PM

## 2019-03-19 NOTE — H&P (Signed)
History and Physical    Joanna Johnston [id number] DOB: Sep 21, 1940 DOA: 03/18/2019  PCP: [facility]  Patient coming from: Home.  Chief Complaint: Rectal bleeding.  HPI: Joanna Johnston is a 78 y.o. female with history of hypothyroidism, hyperlipidemia diverticulosis last colonoscopy done 3 years ago has had a bout of diverticulitis a year and a half ago was followed by Dr. Cristina Gong gastroenterologist presents to the ER admits that [facility] with ongoing rectal bleeding with maroon-colored stools at least 3 times a day for last 3 to 4 days.  Denies any vomiting or abdominal pain.  Denies any dizziness loss of consciousness fever chills.  Has been using Macrodantin for suppression of chronic UTI.  Patient has had recent barium swallow study done by Dr. Cristina Gong for dysphagia.  ED Course: In the ER CT abdomen pelvis was unremarkable patient was hemodynamically stable labs show creatinine 1.1 no old labs which are available is almost 8 years ago.  WBC count 10.6 COVID-19 negative hemoglobin was 12.8.  Patient admitted for further observation.  Review of Systems: As per HPI, rest all negative.   Past Medical History:  Diagnosis Date  . Diverticulosis   . GERD (gastroesophageal reflux disease)   . High cholesterol   . History of kidney stones   . History of urinary tract infection   . Hypothyroidism   . Sepsis ([place])    approx 6 years ago     Past Surgical History:  Procedure Laterality Date  . ABDOMINAL HYSTERECTOMY    . BUNIONECTOMY Right   . EXTRACORPOREAL SHOCK WAVE LITHOTRIPSY Right 01/27/2017   Procedure: EXTRACORPOREAL SHOCK WAVE LITHOTRIPSY (ESWL);  Surgeon: Rana Snare, MD;  Location: [facility];  Service: Urology;  Laterality: Right;  . THYROIDECTOMY Left      reports that she has never smoked. She has never used smokeless tobacco. She reports current alcohol use of about 7.0 standard drinks of alcohol per week. She reports that she does not use drugs.   Allergies  Allergen Reactions  . Codeine Nausea Only    Family History  Problem Relation Age of Onset  . Colon cancer Father   . Diabetes Mellitus II Sister     Prior to Admission medications   Medication Sig Start Date End Date Taking? Authorizing Provider  Biotin 5000 MCG TABS Take 1 tablet by mouth daily.    Yes [provider]  levothyroxine (SYNTHROID, LEVOTHROID) 75 MCG tablet Take 75 mcg by mouth daily.   Yes [provider]  nitrofurantoin (MACRODANTIN) 100 MG capsule Take 100 mg by mouth daily.   Yes [provider]  pantoprazole (PROTONIX) 40 MG tablet Take 40 mg by mouth 2 (two) times daily.   Yes [provider]  rosuvastatin (CRESTOR) 20 MG tablet Take 20 mg by mouth daily.   Yes [provider]  HYDROcodone-acetaminophen (NORCO/VICODIN) 5-325 MG tablet Take 1-2 tablets by mouth every 6 (six) hours as needed. Patient not taking: Reported on 03/19/2019 01/27/17   Rana Snare, MD    Physical Exam: Constitutional: Moderately built and nourished. Vitals:   03/19/19 0000 03/19/19 0030 03/19/19 0130 03/19/19 0256  BP: 140/68 (!) 158/64 (!) 145/61 (!) 167/92  Pulse: 89 92 87 89  Resp:  20  16  Temp:    98 F (36.7 C)  TempSrc:    Oral  SpO2: 97% 99% 98% 100%  Weight:      Height:       Eyes: Anicteric no pallor.  ENMT: No discharge from the ears eyes nose or mouth. Neck: No mass or.  No neck rigidity. Respiratory: No rhonchi or crepitations. Cardiovascular: S1-S2 heard. Abdomen: Soft nontender bowel sound present. Musculoskeletal: No edema.  No joint effusion. Skin: No rash. Neurologic: Alert awake oriented to time place and person.  Moves all extremities. Psychiatric: Appears normal per normal affect.   Labs on Admission: I have personally reviewed following labs and imaging studies  CBC: Recent Labs  Lab 03/18/19 1932  WBC 10.6*  HGB 12.8  HCT 39.0  MCV 88.8  PLT 0000000   Basic Metabolic Panel: Recent Labs   Lab 03/18/19 1932  NA 138  K 3.9  CL 101  CO2 23  GLUCOSE 128*  BUN 25*  CREATININE 1.18*  CALCIUM 9.4   GFR: Estimated Creatinine Clearance: 44.4 mL/min (A) (by C-G formula based on SCr of 1.18 mg/dL (H)). Liver Function Tests: Recent Labs  Lab 03/18/19 1932  AST 28  ALT 21  ALKPHOS 76  BILITOT 0.6  PROT 7.8  ALBUMIN 4.6   No results for input(s): LIPASE, AMYLASE in the last 168 hours. No results for input(s): AMMONIA in the last 168 hours. Coagulation Profile: No results for input(s): INR, PROTIME in the last 168 hours. Cardiac Enzymes: No results for input(s): CKTOTAL, CKMB, CKMBINDEX, TROPONINI in the last 168 hours. BNP (last 3 results) No results for input(s): PROBNP in the last 8760 hours. HbA1C: No results for input(s): HGBA1C in the last 72 hours. CBG: No results for input(s): GLUCAP in the last 168 hours. Lipid Profile: No results for input(s): CHOL, HDL, LDLCALC, TRIG, CHOLHDL, LDLDIRECT in the last 72 hours. Thyroid Function Tests: No results for input(s): TSH, T4TOTAL, FREET4, T3FREE, THYROIDAB in the last 72 hours. Anemia Panel: No results for input(s): VITAMINB12, FOLATE, FERRITIN, TIBC, IRON, RETICCTPCT in the last 72 hours. Urine analysis:    Component Value Date/Time   COLORURINE YELLOW 04/01/2011 1928   APPEARANCEUR HAZY (A) 04/01/2011 1928   LABSPEC 1.015 04/01/2011 1928   PHURINE 5.5 04/01/2011 1928   GLUCOSEU NEGATIVE 04/01/2011 1928   HGBUR LARGE (A) 04/01/2011 1928   BILIRUBINUR NEGATIVE 04/01/2011 1928   KETONESUR NEGATIVE 04/01/2011 1928   PROTEINUR NEGATIVE 04/01/2011 1928   UROBILINOGEN 0.2 04/01/2011 1928   NITRITE POSITIVE (A) 04/01/2011 1928   LEUKOCYTESUR MODERATE (A) 04/01/2011 1928   Sepsis Labs: @LABRCNTIP (procalcitonin:4,lacticidven:4) ) Recent Results (from the past 240 hour(s))  SARS Coronavirus 2 [facility] order, Performed in [facility] hospital lab) Nasopharyngeal Nasopharyngeal Swab     Status: None    Collection Time: 03/18/19 10:42 PM   Specimen: Nasopharyngeal Swab  Result Value Ref Range Status   SARS Coronavirus 2 NEGATIVE NEGATIVE Final    Comment: (NOTE) If result is NEGATIVE SARS-CoV-2 target nucleic acids are NOT DETECTED. The SARS-CoV-2 RNA is generally detectable in upper and lower  respiratory specimens during the acute phase of infection. The lowest  concentration of SARS-CoV-2 viral copies this assay can detect is 250  copies / mL. A negative result does not preclude SARS-CoV-2 infection  and should not be used as the sole basis for treatment or other  patient management decisions.  A negative result may occur with  improper specimen collection / handling, submission of specimen other  than nasopharyngeal swab, presence of viral mutation(s) within the  areas targeted by this assay, and inadequate number of viral copies  (<250 copies / mL). A negative result must be combined with clinical  observations, patient history, and epidemiological  information. If result is POSITIVE SARS-CoV-2 target nucleic acids are DETECTED. The SARS-CoV-2 RNA is generally detectable in upper and lower  respiratory specimens dur ing the acute phase of infection.  Positive  results are indicative of active infection with SARS-CoV-2.  Clinical  correlation with patient history and other diagnostic information is  necessary to determine patient infection status.  Positive results do  not rule out bacterial infection or co-infection with other viruses. If result is PRESUMPTIVE POSTIVE SARS-CoV-2 nucleic acids MAY BE PRESENT.   A presumptive positive result was obtained on the submitted specimen  and confirmed on repeat testing.  While 2019 novel coronavirus  (SARS-CoV-2) nucleic acids may be present in the submitted sample  additional confirmatory testing may be necessary for epidemiological  and / or clinical management purposes  to differentiate between  SARS-CoV-2 and other Sarbecovirus  currently known to infect humans.  If clinically indicated additional testing with an alternate test  methodology [phone]) is advised. The SARS-CoV-2 RNA is generally  detectable in upper and lower respiratory sp ecimens during the acute  phase of infection. The expected result is Negative. Fact Sheet for Patients:  StrictlyIdeas.no Fact Sheet for Healthcare Providers: BankingDealers.co.za This test is not yet approved or cleared by the [place] FDA and has been authorized for detection and/or diagnosis of SARS-CoV-2 by FDA under an Emergency Use Authorization (EUA).  This EUA will remain in effect (meaning this test can be used) for the duration of the COVID-19 declaration under Section 564(b)(1) of the Act, 21 U.S.C. section 360bbb-3(b)(1), unless the authorization is terminated or revoked sooner. Performed at [facility], [address] [place], [place] [address]      Radiological Exams on Admission: Ct Abdomen Pelvis W Contrast  Result Date: 03/18/2019 CLINICAL DATA:  Abdominal pain with diverticulitis suspected. Maroon stool. EXAM: CT ABDOMEN AND PELVIS WITH CONTRAST TECHNIQUE: Multidetector CT imaging of the abdomen and pelvis was performed using the standard protocol following bolus administration of intravenous contrast. CONTRAST:  121mL OMNIPAQUE IOHEXOL 300 MG/ML  SOLN COMPARISON:  CT dated August 09, 2014 FINDINGS: Lower chest: The lung bases are clear. The heart size is normal. Hepatobiliary: Again noted are scattered hypoattenuating areas in the liver. Normal gallbladder.There is no biliary ductal dilation. Pancreas: There is a stable 6 mm hypoattenuating nodule rising from the pancreatic tail. Spleen: No splenic laceration or hematoma. Adrenals/Urinary Tract: --Adrenal glands: No adrenal hemorrhage. --Right kidney/ureter: No hydronephrosis or perinephric hematoma. --Left kidney/ureter: There are nonobstructing  stones in the lower pole measuring up to approximately 6 mm. --Urinary bladder: Unremarkable. Stomach/Bowel: --Stomach/Duodenum: There is a small hiatal hernia. --Small bowel: No dilatation or inflammation. --Colon: Rectosigmoid diverticulosis without acute inflammation. --Appendix: Not visualized. No right lower quadrant inflammation or free fluid. Vascular/Lymphatic: Atherosclerotic calcification is present within the non-aneurysmal abdominal aorta, without hemodynamically significant stenosis. --No retroperitoneal lymphadenopathy. --No mesenteric lymphadenopathy. --No pelvic or inguinal lymphadenopathy. Reproductive: Normal uterus and ovaries. Other: No ascites or free air. The abdominal wall is normal. Musculoskeletal. There is a large lipoma in the right gluteal region. There is no displaced fracture. IMPRESSION: 1. Diverticulosis without CT evidence for diverticulitis. 2. Nonobstructing left-sided nephrolithiasis. Electronically Signed   By: Constance Holster M.D.   On: 03/18/2019 21:51    Assessment/Plan Principal Problem:   Rectal bleeding Active Problems:   Hypothyroidism   HLD (hyperlipidemia)    1. Rectal bleeding -has history of diverticulosis and previous history of diverticulitis and CT scan did show some diverticulosis this  time.  Likely source could be diverticulosis.  Patient not on any anticoagulation.  Will follow serial CBCs and may discuss with Dr. Doyne Keel in the morning for now we will keep patient on clear liquids. 2. Hypothyroidism on Synthroid. 3. Hyperlipidemia on statins. 4. Generalized weakness but no focal deficits presently has been feeling some weakness in the lower extremities.  Closely observe.  Patient states usually hikes every month.   DVT prophylaxis: SCDs. Code Status: Full code. Family Communication: Discussed with patient. Disposition Plan: Home. Consults called: None. Admission status: Observation.   Rise Patience MD [facility] Pager  [phone].  If 7PM-7AM, please contact night-coverage [email or web address] Password [email or web address]  03/19/2019, 3:37 AM

## 2019-03-19 NOTE — ED Notes (Signed)
Please address need for Airborne Precautions order. If actually needed, our department will be unable to accept this patient.  Thank you. Mortimer Fries, RN

## 2019-03-19 NOTE — Progress Notes (Addendum)
Patient seen and examined at bedside, patient admitted after midnight, please see earlier detailed admission note by Rise Patience, MD. Briefly, patient presented with rectal bleeding. Multiple bloody stools this morning; maroon colored. CT significant for diverticulosis. Discussed with GI who recommend continued observation, clear liquid diet. Will evaluate patient during admission. Has started Miralax BID. Plan discussed with patient and she is in agreement.   Cordelia Poche, MD [facility] 03/19/2019, 10:33 AM

## 2019-03-19 NOTE — Progress Notes (Signed)
Paged admissions to inform that patient has arrived to the unit.

## 2019-03-20 LAB — CBC
HCT: 36.2 % (ref 36.0–46.0)
Hemoglobin: 11.9 g/dL — ABNORMAL LOW (ref 12.0–15.0)
MCH: 29.8 pg (ref 26.0–34.0)
MCHC: 32.9 g/dL (ref 30.0–36.0)
MCV: 90.5 fL (ref 80.0–100.0)
Platelets: 195 10*3/uL (ref 150–400)
RBC: 4 MIL/uL (ref 3.87–5.11)
RDW: 13.7 % (ref 11.5–15.5)
WBC: 6.2 10*3/uL (ref 4.0–10.5)
nRBC: 0 % (ref 0.0–0.2)

## 2019-03-20 NOTE — Discharge Instructions (Signed)
Joanna Johnston,  You were in the hospital because of bleeding per rectum. This is likely diverticular bleeding. Your blood counts were monitored overnight. Thankfully, your bleeding stopped and your blood counts remained stable. Please follow-up with your PCP and keep in touch with your GI physician, Dr. Cristina Gong.  Cordelia Poche, MD

## 2019-03-20 NOTE — Plan of Care (Signed)
  Problem: Clinical Measurements: Goal: Ability to maintain clinical measurements within normal limits will improve Outcome: Progressing Goal: Will remain free from infection Outcome: Progressing Goal: Diagnostic test results will improve Outcome: Progressing   Problem: Nutrition: Goal: Adequate nutrition will be maintained Outcome: Progressing   Problem: Elimination: Goal: Will not experience complications related to bowel motility Outcome: Progressing   

## 2019-03-20 NOTE — Consult Note (Signed)
Referring Provider:  Dr. Cordelia Poche Primary Care Physician:  [facility] Primary Gastroenterologist:  Dr. Cristina Gong  Reason for Consultation: Hematochezia and abdominal pain.  HPI: Joanna Johnston is a 78 y.o. female who is a longtime patient of mine, and who was traveling in the [place] when she called to report a problem with right lower quadrant pain, back pain, and hematochezia.  This brought to mind the possibility of ischemic colitis and she was advised to seek medical attention locally, but she drove back to [facility] and presented herself to the emergency room where her hemoglobin was 11.9, BUN 20, and a CT scan showed diverticulosis (which she is known to have) but no evidence of ischemic colitis.  White count was normal.  Hemoglobin has remained stable during her 24 hours in the hospital.  The patient has recently been having symptoms of esophageal dysphagia, for which her PPI dose was increased.  8 years ago, she was treated for Helicobacter pylori infection because of dyspeptic symptoms.  There is no prior history of GI bleeding.  There is a family history of colon cancer in her father at age 32, but the patient herself has had numerous colonoscopies without any history of advanced adenomas and with her most recent exam, 3 years ago, having been negative for any polyps, although she did have diverticulosis.  She had had multiple episodes of bloody BM's Monday through Thursday, yet never to the point of orthostasis or overt weakness, although she did notice reduced exercise tolerance [place].  She did stop her 81 mg ASA 5 days ago after first noticing the bleeding.   Since she was admitted to the hospital early yesterday morning, her abdominal pain has resolved and she has had no further BM's or bleeding.  Her hgb has remained stable at 11.9 overnight. She feels better and in fact feels ready to go home.   Past Medical History:  Diagnosis  Date  . Diverticulosis   . GERD (gastroesophageal reflux disease)   . High cholesterol   . History of kidney stones   . History of urinary tract infection   . Hypothyroidism   . Sepsis ([place])    approx 6 years ago     Past Surgical History:  Procedure Laterality Date  . ABDOMINAL HYSTERECTOMY    . BUNIONECTOMY Right   . EXTRACORPOREAL SHOCK WAVE LITHOTRIPSY Right 01/27/2017   Procedure: EXTRACORPOREAL SHOCK WAVE LITHOTRIPSY (ESWL);  Surgeon: Rana Snare, MD;  Location: [facility];  Service: Urology;  Laterality: Right;  . THYROIDECTOMY Left     Prior to Admission medications   Medication Sig Start Date End Date Taking? Authorizing Provider  Biotin 5000 MCG TABS Take 1 tablet by mouth daily.    Yes [provider]  levothyroxine (SYNTHROID, LEVOTHROID) 75 MCG tablet Take 75 mcg by mouth daily.   Yes [provider]  nitrofurantoin (MACRODANTIN) 100 MG capsule Take 100 mg by mouth daily.   Yes [provider]  pantoprazole (PROTONIX) 40 MG tablet Take 40 mg by mouth 2 (two) times daily.   Yes [provider]  rosuvastatin (CRESTOR) 20 MG tablet Take 20 mg by mouth daily.   Yes [provider]  HYDROcodone-acetaminophen (NORCO/VICODIN) 5-325 MG tablet Take 1-2 tablets by mouth every 6 (six) hours as needed. Patient not taking: Reported on 03/19/2019 01/27/17   Rana Snare, MD    Current Facility-Administered Medications  Medication Dose Route Frequency Provider Last Rate Last Dose  .  acetaminophen (TYLENOL) tablet 650 mg  650 mg Oral Q6H PRN Rise Patience, MD       Or  . acetaminophen (TYLENOL) suppository 650 mg  650 mg Rectal Q6H PRN Rise Patience, MD      . levothyroxine (SYNTHROID) tablet 75 mcg  75 mcg Oral Daily Rise Patience, MD   75 mcg at 03/20/19 0500  . nitrofurantoin (MACRODANTIN) capsule 100 mg  100 mg Oral Daily Rise Patience, MD   100 mg at 03/19/19 2126  . ondansetron (ZOFRAN) tablet 4 mg  4 mg Oral  Q6H PRN Rise Patience, MD       Or  . ondansetron [facility]) injection 4 mg  4 mg Intravenous Q6H PRN Rise Patience, MD      . pantoprazole (PROTONIX) EC tablet 40 mg  40 mg Oral BID Rise Patience, MD   40 mg at 03/19/19 2126  . polyethylene glycol (MIRALAX / GLYCOLAX) packet 17 g  17 g Oral BID Mariel Aloe, MD   17 g at 03/19/19 2126  . rosuvastatin (CRESTOR) tablet 20 mg  20 mg Oral q1800 Rise Patience, MD       Facility-Administered Medications Ordered in Other Encounters  Medication Dose Route Frequency Provider Last Rate Last Dose  . 0.9 %  sodium chloride infusion   Intravenous Continuous Rana Snare, MD      . ciprofloxacin (CIPRO) tablet 500 mg  500 mg Oral 60 min Pre-Op Rana Snare, MD        Allergies as of 03/18/2019 - Review Complete 03/18/2019  Allergen Reaction Noted  . Codeine Nausea Only 01/24/2017    Family History  Problem Relation Age of Onset  . Colon cancer Father   . Diabetes Mellitus II Sister     Social History   Socioeconomic History  . Marital status: Married    Spouse name: Not on file  . Number of children: Not on file  . Years of education: Not on file  . Highest education level: Not on file  Occupational History  . Not on file  Social Needs  . Financial resource strain: Not on file  . Food insecurity    Worry: Not on file    Inability: Not on file  . Transportation needs    Medical: Not on file    Non-medical: Not on file  Tobacco Use  . Smoking status: Never Smoker  . Smokeless tobacco: Never Used  Substance and Sexual Activity  . Alcohol use: Yes    Alcohol/week: 7.0 standard drinks    Types: 7 Glasses of wine per week    Comment: glass of wine daily   . Drug use: No  . Sexual activity: Not on file  Lifestyle  . Physical activity    Days per week: Not on file    Minutes per session: Not on file  . Stress: Not on file  Relationships  . Social Herbalist on phone: Not on file    Gets  together: Not on file    Attends religious service: Not on file    Active member of club or organization: Not on file    Attends meetings of clubs or organizations: Not on file    Relationship status: Not on file  . Intimate partner violence    Fear of current or ex partner: Not on file    Emotionally abused: Not on file    Physically abused: Not on file  Forced sexual activity: Not on file  Other Topics Concern  . Not on file  Social History Narrative  . Not on file    Review of Systems: See HPI.  Did have transient epigastric pain before all this started.  ROS neg for chest pain or shortness of breath.  Also, the dysphagia sx for which I had recently had a telephone encounter with her have resolved.  Physical Exam: Vital signs in last 24 hours: Temp:  [97.9 F (36.6 C)-98.6 F (37 C)] 97.9 F (36.6 C) (09/05 0458) Pulse Rate:  [64-70] 67 (09/05 0458) Resp:  [16] 16 (09/05 0458) BP: (131-136)/(62-74) 131/65 (09/05 0458) SpO2:  [98 %-100 %] 98 % (09/05 0458) Last BM Date: 03/19/19 General:   Alert,  Well-developed, well-nourished, pleasant and cooperative in NAD Head:  Normocephalic and atraumatic. Eyes:  Sclera clear, no icterus.   Lungs:  Clear throughout to auscultation.   No wheezes, crackles, or rhonchi. No evident respiratory distress. Heart:   Regular rate and rhythm; no murmurs, clicks, rubs,  or gallops. Abdomen:  Soft, nontender, nontympanitic, and nondistended.  Msk:   Symmetrical without gross deformities. Pulses:  Normal radial pulse is noted. Extremities:   Without edema. Neurologic:  Alert and coherent;  grossly normal neurologically. Skin:  Intact without significant lesions or rashes. Psych:   Alert and cooperative. Normal mood and affect.  Intake/Output from previous day: 09/04 0701 - 09/05 0700 In: 1709.6 [P.O.:1200; I.V.:509.6] Out: 200 [Urine:200] Intake/Output this shift: No intake/output data recorded.  Lab Results: Recent Labs     03/19/19 1311 03/19/19 2037 03/20/19 0539  WBC 6.3 7.4 6.2  HGB 11.5* 11.8* 11.9*  HCT 35.3* 36.5 36.2  PLT 197 200 195   BMET Recent Labs    03/18/19 1932 03/19/19 0507  NA 138 140  K 3.9 3.4*  CL 101 107  CO2 23 24  GLUCOSE 128* 119*  BUN 25* 20  CREATININE 1.18* 1.00  CALCIUM 9.4 9.0   LFT Recent Labs    03/18/19 1932  PROT 7.8  ALBUMIN 4.6  AST 28  ALT 21  ALKPHOS 76  BILITOT 0.6   PT/INR No results for input(s): LABPROT, INR in the last 72 hours.  Studies/Results: Ct Abdomen Pelvis W Contrast  Result Date: 03/18/2019 CLINICAL DATA:  Abdominal pain with diverticulitis suspected. Maroon stool. EXAM: CT ABDOMEN AND PELVIS WITH CONTRAST TECHNIQUE: Multidetector CT imaging of the abdomen and pelvis was performed using the standard protocol following bolus administration of intravenous contrast. CONTRAST:  150mL OMNIPAQUE IOHEXOL 300 MG/ML  SOLN COMPARISON:  CT dated August 09, 2014 FINDINGS: Lower chest: The lung bases are clear. The heart size is normal. Hepatobiliary: Again noted are scattered hypoattenuating areas in the liver. Normal gallbladder.There is no biliary ductal dilation. Pancreas: There is a stable 6 mm hypoattenuating nodule rising from the pancreatic tail. Spleen: No splenic laceration or hematoma. Adrenals/Urinary Tract: --Adrenal glands: No adrenal hemorrhage. --Right kidney/ureter: No hydronephrosis or perinephric hematoma. --Left kidney/ureter: There are nonobstructing stones in the lower pole measuring up to approximately 6 mm. --Urinary bladder: Unremarkable. Stomach/Bowel: --Stomach/Duodenum: There is a small hiatal hernia. --Small bowel: No dilatation or inflammation. --Colon: Rectosigmoid diverticulosis without acute inflammation. --Appendix: Not visualized. No right lower quadrant inflammation or free fluid. Vascular/Lymphatic: Atherosclerotic calcification is present within the non-aneurysmal abdominal aorta, without hemodynamically significant  stenosis. --No retroperitoneal lymphadenopathy. --No mesenteric lymphadenopathy. --No pelvic or inguinal lymphadenopathy. Reproductive: Normal uterus and ovaries. Other: No ascites or free air. The abdominal wall is  normal. Musculoskeletal. There is a large lipoma in the right gluteal region. There is no displaced fracture. IMPRESSION: 1. Diverticulosis without CT evidence for diverticulitis. 2. Nonobstructing left-sided nephrolithiasis. Electronically Signed   By: Constance Holster M.D.   On: 03/18/2019 21:51    Impression: 1.  Recurrent hematochezia over several days. Overall picture most c/w diverticular bleeding, now resolved.  2. Minimal posthemorrhagic anemia  Plan: 1. OK for dischg from my standpoint.  Pro's and con's of continued observation in the hospital (taking into account the tendency for diverticular bleeding to stop and then re-start) discussed.  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  In the meantime, I have ordered for her diet to be advanced.  2. No dietary restrictions.  3. Recommended pt hold her ASA (primary prophylaxis) and avoid NSAID's for 2 wks.  4. Extensive education provided about diverticular disease/diverticular hemorrhage.  5. Pt to call me if she has further bleeding at home.  6. We will send the pt a set of home hemoccults to be performed a couple of weeks from now; if she remains heme positive, might need colonoscopy but otherwise she's up to date on her exams and I don't think this bleeding episode mandates earlier colonoscopic evaluation.  7. Pt indicates she'll be having bld work for her CPE in about 6 wks, which I think would be an appropriate time to re-check her hgb to confirm normalization.  Cleotis Nipper, M.D. Pager [phone] If no answer or after 5 PM  call [phone]      LOS: 1 day   [place]  03/20/2019, 7:42 AM   Pager [phone] If no answer or after 5 PM call [phone]

## 2019-03-20 NOTE — Discharge Summary (Signed)
Physician Discharge Summary  Joanna Johnston [id number] DOB: January 07, 1941 DOA: 03/18/2019  PCP: [facility]  Admit date: 03/18/2019 Discharge date: 03/20/2019  Admitted From: Home Disposition: Home  Recommendations for Outpatient Follow-up:  1. Follow up with PCP in 1 week 2. Follow up with GI 3. Please obtain BMP/CBC in one week 4. Please follow up on the following pending results: None  Home Health: None Equipment/Devices: None  Discharge Condition: Stable CODE STATUS: Full code Diet recommendation: Regular diet   Brief/Interim Summary:  Admission HPI written by Rise Patience, MD   Chief Complaint: Rectal bleeding.  HPI: Joanna Johnston is a 78 y.o. female with history of hypothyroidism, hyperlipidemia diverticulosis last colonoscopy done 3 years ago has had a bout of diverticulitis a year and a half ago was followed by Dr. Cristina Gong gastroenterologist presents to the ER admits that [facility] with ongoing rectal bleeding with maroon-colored stools at least 3 times a day for last 3 to 4 days.  Denies any vomiting or abdominal pain.  Denies any dizziness loss of consciousness fever chills.  Has been using Macrodantin for suppression of chronic UTI.  Patient has had recent barium swallow study done by Dr. Cristina Gong for dysphagia.  ED Course: In the ER CT abdomen pelvis was unremarkable patient was hemodynamically stable labs show creatinine 1.1 no old labs which are available is almost 8 years ago.  WBC count 10.6 COVID-19 negative hemoglobin was 12.8.  Patient admitted for further observation.    Hospital course:  Rectal bleeding CT scan significant for diverticulosis. No evidence of bowel ischemia. GI consulted and recommended observation overnight. Miralax given. No recurrent bleeding overnight prior to discharge. Will need outpatient follow-up with GI.  Hypothyroidism Continue Synthroid  Discharge Diagnoses:  Principal Problem:   Rectal  bleeding Active Problems:   Hypothyroidism   HLD (hyperlipidemia)    Discharge Instructions  Discharge Instructions    Call MD for:  persistant nausea and vomiting   Complete by: As directed    Call MD for:  severe uncontrolled pain   Complete by: As directed    Call MD for:  temperature >100.4   Complete by: As directed    Increase activity slowly   Complete by: As directed      Allergies as of 03/20/2019      Reactions   Codeine Nausea Only      Medication List    TAKE these medications   Biotin 5000 MCG Tabs Take 1 tablet by mouth daily.   HYDROcodone-acetaminophen 5-325 MG tablet Commonly known as: NORCO/VICODIN Take 1-2 tablets by mouth every 6 (six) hours as needed.   levothyroxine 75 MCG tablet Commonly known as: SYNTHROID Take 75 mcg by mouth daily.   nitrofurantoin 100 MG capsule Commonly known as: MACRODANTIN Take 100 mg by mouth daily.   pantoprazole 40 MG tablet Commonly known as: PROTONIX Take 40 mg by mouth 2 (two) times daily.   rosuvastatin 20 MG tablet Commonly known as: CRESTOR Take 20 mg by mouth daily.      Follow-up Information    [facility] [employer]. Schedule an appointment as soon as possible for a visit in 2 week(s).   Why: Hospital follow-up Contact information: [address] [place] [address] [phone]          Allergies  Allergen Reactions  . Codeine Nausea Only    Consultations:  [facility]   Procedures/Studies: Ct Abdomen Pelvis W Contrast  Result Date: 03/18/2019  CLINICAL DATA:  Abdominal pain with diverticulitis suspected. Maroon stool. EXAM: CT ABDOMEN AND PELVIS WITH CONTRAST TECHNIQUE: Multidetector CT imaging of the abdomen and pelvis was performed using the standard protocol following bolus administration of intravenous contrast. CONTRAST:  192mL OMNIPAQUE IOHEXOL 300 MG/ML  SOLN COMPARISON:  CT dated August 09, 2014 FINDINGS: Lower chest: The lung bases are clear. The heart size is normal.  Hepatobiliary: Again noted are scattered hypoattenuating areas in the liver. Normal gallbladder.There is no biliary ductal dilation. Pancreas: There is a stable 6 mm hypoattenuating nodule rising from the pancreatic tail. Spleen: No splenic laceration or hematoma. Adrenals/Urinary Tract: --Adrenal glands: No adrenal hemorrhage. --Right kidney/ureter: No hydronephrosis or perinephric hematoma. --Left kidney/ureter: There are nonobstructing stones in the lower pole measuring up to approximately 6 mm. --Urinary bladder: Unremarkable. Stomach/Bowel: --Stomach/Duodenum: There is a small hiatal hernia. --Small bowel: No dilatation or inflammation. --Colon: Rectosigmoid diverticulosis without acute inflammation. --Appendix: Not visualized. No right lower quadrant inflammation or free fluid. Vascular/Lymphatic: Atherosclerotic calcification is present within the non-aneurysmal abdominal aorta, without hemodynamically significant stenosis. --No retroperitoneal lymphadenopathy. --No mesenteric lymphadenopathy. --No pelvic or inguinal lymphadenopathy. Reproductive: Normal uterus and ovaries. Other: No ascites or free air. The abdominal wall is normal. Musculoskeletal. There is a large lipoma in the right gluteal region. There is no displaced fracture. IMPRESSION: 1. Diverticulosis without CT evidence for diverticulitis. 2. Nonobstructing left-sided nephrolithiasis. Electronically Signed   By: Constance Holster M.D.   On: 03/18/2019 21:51   Dg Esophagus W Double Cm (hd)  Result Date: 03/03/2019 CLINICAL DATA:  Dysphagia. Food and liquids getting stuck in throat. EXAM: ESOPHOGRAM / BARIUM SWALLOW / BARIUM TABLET STUDY TECHNIQUE: Combined double contrast and single contrast examination performed using effervescent crystals, thick barium liquid, and thin barium liquid. The patient was observed with fluoroscopy swallowing a 13 mm barium sulphate tablet. FLUOROSCOPY TIME:  Fluoroscopy Time:  1 minutes and 24 seconds. Radiation  Exposure Index (if provided by the fluoroscopic device): 98 mGy Number of Acquired Spot Images: COMPARISON:  None. FINDINGS: Frontal and lateral views of the hypopharynx while swallowing thick barium are unremarkable. Double contrast imaging of the esophagus reveals no evidence for mass lesion, diverticulum, mucosal ulceration, or esophageal stricture. Evaluation of esophageal motility reveals episodes of proximal escape with preservation of the primary peristaltic stripping wave. No substantial tertiary contractions or presbyesophagus. Tiny sliding type hiatal hernia observed. 13 mm barium tablet passes readily into the stomach when taken with water. IMPRESSION: 1. Tiny hiatal hernia. Otherwise normal double contrast barium esophagram. Electronically Signed   By: Misty Stanley M.D.   On: 03/03/2019 11:49      Subjective: No bleeding overnight.  Discharge Exam: Vitals:   03/19/19 2101 03/20/19 0458  BP: 134/74 131/65  Pulse: 70 67  Resp: 16 16  Temp: 98.1 F (36.7 C) 97.9 F (36.6 C)  SpO2: 99% 98%   Vitals:   03/19/19 0711 03/19/19 1345 03/19/19 2101 03/20/19 0458  BP: (!) 143/66 136/62 134/74 131/65  Pulse: 75 64 70 67  Resp: 16 16 16 16   Temp: 97.8 F (36.6 C) 98.6 F (37 C) 98.1 F (36.7 C) 97.9 F (36.6 C)  TempSrc: Oral  Oral Oral  SpO2: 98% 100% 99% 98%  Weight:      Height:        General: Pt is alert, awake, not in acute distress Cardiovascular: RRR, S1/S2 +, no rubs, no gallops Respiratory: CTA bilaterally, no wheezing, no rhonchi Abdominal: Soft, NT, ND, bowel sounds + Extremities:  no edema, no cyanosis    The results of significant diagnostics from this hospitalization (including imaging, microbiology, ancillary and laboratory) are listed below for reference.     Microbiology: Recent Results (from the past 240 hour(s))  SARS Coronavirus 2 [facility] order, Performed in [facility] hospital lab) Nasopharyngeal Nasopharyngeal Swab     Status: None    Collection Time: 03/18/19 10:42 PM   Specimen: Nasopharyngeal Swab  Result Value Ref Range Status   SARS Coronavirus 2 NEGATIVE NEGATIVE Final    Comment: (NOTE) If result is NEGATIVE SARS-CoV-2 target nucleic acids are NOT DETECTED. The SARS-CoV-2 RNA is generally detectable in upper and lower  respiratory specimens during the acute phase of infection. The lowest  concentration of SARS-CoV-2 viral copies this assay can detect is 250  copies / mL. A negative result does not preclude SARS-CoV-2 infection  and should not be used as the sole basis for treatment or other  patient management decisions.  A negative result may occur with  improper specimen collection / handling, submission of specimen other  than nasopharyngeal swab, presence of viral mutation(s) within the  areas targeted by this assay, and inadequate number of viral copies  (<250 copies / mL). A negative result must be combined with clinical  observations, patient history, and epidemiological information. If result is POSITIVE SARS-CoV-2 target nucleic acids are DETECTED. The SARS-CoV-2 RNA is generally detectable in upper and lower  respiratory specimens dur ing the acute phase of infection.  Positive  results are indicative of active infection with SARS-CoV-2.  Clinical  correlation with patient history and other diagnostic information is  necessary to determine patient infection status.  Positive results do  not rule out bacterial infection or co-infection with other viruses. If result is PRESUMPTIVE POSTIVE SARS-CoV-2 nucleic acids MAY BE PRESENT.   A presumptive positive result was obtained on the submitted specimen  and confirmed on repeat testing.  While 2019 novel coronavirus  (SARS-CoV-2) nucleic acids may be present in the submitted sample  additional confirmatory testing may be necessary for epidemiological  and / or clinical management purposes  to differentiate between  SARS-CoV-2 and other Sarbecovirus  currently known to infect humans.  If clinically indicated additional testing with an alternate test  methodology [phone]) is advised. The SARS-CoV-2 RNA is generally  detectable in upper and lower respiratory sp ecimens during the acute  phase of infection. The expected result is Negative. Fact Sheet for Patients:  StrictlyIdeas.no Fact Sheet for Healthcare Providers: BankingDealers.co.za This test is not yet approved or cleared by the [place] FDA and has been authorized for detection and/or diagnosis of SARS-CoV-2 by FDA under an Emergency Use Authorization (EUA).  This EUA will remain in effect (meaning this test can be used) for the duration of the COVID-19 declaration under Section 564(b)(1) of the Act, 21 U.S.C. section 360bbb-3(b)(1), unless the authorization is terminated or revoked sooner. Performed at [facility], [address] [place], [place] [address]      Labs: BNP (last 3 results) No results for input(s): BNP in the last 8760 hours. Basic Metabolic Panel: Recent Labs  Lab 03/18/19 1932 03/19/19 0507  NA 138 140  K 3.9 3.4*  CL 101 107  CO2 23 24  GLUCOSE 128* 119*  BUN 25* 20  CREATININE 1.18* 1.00  CALCIUM 9.4 9.0   Liver Function Tests: Recent Labs  Lab 03/18/19 1932  AST 28  ALT 21  ALKPHOS 76  BILITOT 0.6  PROT 7.8  ALBUMIN 4.6   No results for input(s): LIPASE, AMYLASE in the last 168 hours. No results for input(s): AMMONIA in the last 168 hours. CBC: Recent Labs  Lab 03/19/19 0507 03/19/19 0857 03/19/19 1311 03/19/19 2037 03/20/19 0539  WBC 7.4 8.0 6.3 7.4 6.2  HGB 12.0 11.9* 11.5* 11.8* 11.9*  HCT 36.5 36.3 35.3* 36.5 36.2  MCV 89.5 90.8 89.4 90.8 90.5  PLT 210 186 197 200 195   Cardiac Enzymes: No results for input(s): CKTOTAL, CKMB, CKMBINDEX, TROPONINI in the last 168 hours. BNP: Invalid input(s): POCBNP CBG: No results for input(s): GLUCAP in the last 168  hours. D-Dimer No results for input(s): DDIMER in the last 72 hours. Hgb A1c No results for input(s): HGBA1C in the last 72 hours. Lipid Profile No results for input(s): CHOL, HDL, LDLCALC, TRIG, CHOLHDL, LDLDIRECT in the last 72 hours. Thyroid function studies No results for input(s): TSH, T4TOTAL, T3FREE, THYROIDAB in the last 72 hours.  Invalid input(s): FREET3 Anemia work up No results for input(s): VITAMINB12, FOLATE, FERRITIN, TIBC, IRON, RETICCTPCT in the last 72 hours. Urinalysis    Component Value Date/Time   COLORURINE YELLOW 04/01/2011 1928   APPEARANCEUR HAZY (A) 04/01/2011 1928   LABSPEC 1.015 04/01/2011 1928   PHURINE 5.5 04/01/2011 1928   GLUCOSEU NEGATIVE 04/01/2011 1928   HGBUR LARGE (A) 04/01/2011 1928   BILIRUBINUR NEGATIVE 04/01/2011 1928   KETONESUR NEGATIVE 04/01/2011 1928   PROTEINUR NEGATIVE 04/01/2011 1928   UROBILINOGEN 0.2 04/01/2011 1928   NITRITE POSITIVE (A) 04/01/2011 1928   LEUKOCYTESUR MODERATE (A) 04/01/2011 1928   Sepsis Labs Invalid input(s): PROCALCITONIN,  WBC,  LACTICIDVEN Microbiology Recent Results (from the past 240 hour(s))  SARS Coronavirus 2 [facility] order, Performed in [facility] hospital lab) Nasopharyngeal Nasopharyngeal Swab     Status: None   Collection Time: 03/18/19 10:42 PM   Specimen: Nasopharyngeal Swab  Result Value Ref Range Status   SARS Coronavirus 2 NEGATIVE NEGATIVE Final    Comment: (NOTE) If result is NEGATIVE SARS-CoV-2 target nucleic acids are NOT DETECTED. The SARS-CoV-2 RNA is generally detectable in upper and lower  respiratory specimens during the acute phase of infection. The lowest  concentration of SARS-CoV-2 viral copies this assay can detect is 250  copies / mL. A negative result does not preclude SARS-CoV-2 infection  and should not be used as the sole basis for treatment or other  patient management decisions.  A negative result may occur with  improper specimen collection / handling,  submission of specimen other  than nasopharyngeal swab, presence of viral mutation(s) within the  areas targeted by this assay, and inadequate number of viral copies  (<250 copies / mL). A negative result must be combined with clinical  observations, patient history, and epidemiological information. If result is POSITIVE SARS-CoV-2 target nucleic acids are DETECTED. The SARS-CoV-2 RNA is generally detectable in upper and lower  respiratory specimens dur ing the acute phase of infection.  Positive  results are indicative of active infection with SARS-CoV-2.  Clinical  correlation with patient history and other diagnostic information is  necessary to determine patient infection status.  Positive results do  not rule out bacterial infection or co-infection with other viruses. If result is PRESUMPTIVE POSTIVE SARS-CoV-2 nucleic acids MAY BE PRESENT.   A presumptive positive result was obtained on the submitted specimen  and confirmed on repeat testing.  While 2019 novel coronavirus  (SARS-CoV-2) nucleic acids may be present in the submitted sample  additional confirmatory testing may be  necessary for epidemiological  and / or clinical management purposes  to differentiate between  SARS-CoV-2 and other Sarbecovirus currently known to infect humans.  If clinically indicated additional testing with an alternate test  methodology [phone]) is advised. The SARS-CoV-2 RNA is generally  detectable in upper and lower respiratory sp ecimens during the acute  phase of infection. The expected result is Negative. Fact Sheet for Patients:  StrictlyIdeas.no Fact Sheet for Healthcare Providers: BankingDealers.co.za This test is not yet approved or cleared by the [place] FDA and has been authorized for detection and/or diagnosis of SARS-CoV-2 by FDA under an Emergency Use Authorization (EUA).  This EUA will remain in effect (meaning this test can be  used) for the duration of the COVID-19 declaration under Section 564(b)(1) of the Act, 21 U.S.C. section 360bbb-3(b)(1), unless the authorization is terminated or revoked sooner. Performed at [facility], [address] [place], [place] [address]      SIGNED:   Cordelia Poche, MD [facility] 03/20/2019, 9:39 AM

## 2019-03-29 ENCOUNTER — Other Ambulatory Visit: Payer: Self-pay | Admitting: Family Medicine

## 2019-03-29 DIAGNOSIS — R1084 Generalized abdominal pain: Secondary | ICD-10-CM | POA: Diagnosis not present

## 2019-03-29 DIAGNOSIS — R509 Fever, unspecified: Secondary | ICD-10-CM | POA: Diagnosis not present

## 2019-03-29 DIAGNOSIS — M545 Low back pain: Secondary | ICD-10-CM | POA: Diagnosis not present

## 2019-03-29 DIAGNOSIS — K589 Irritable bowel syndrome without diarrhea: Secondary | ICD-10-CM | POA: Diagnosis not present

## 2019-03-29 DIAGNOSIS — R1030 Lower abdominal pain, unspecified: Secondary | ICD-10-CM | POA: Diagnosis not present

## 2019-03-29 DIAGNOSIS — R198 Other specified symptoms and signs involving the digestive system and abdomen: Secondary | ICD-10-CM | POA: Diagnosis not present

## 2019-04-01 LAB — C-REACTIVE PROTEIN: CRP: 73 mg/L — ABNORMAL HIGH (ref 0–10)

## 2019-04-07 DIAGNOSIS — Z23 Encounter for immunization: Secondary | ICD-10-CM | POA: Diagnosis not present

## 2019-04-22 DIAGNOSIS — Z1231 Encounter for screening mammogram for malignant neoplasm of breast: Secondary | ICD-10-CM | POA: Diagnosis not present

## 2019-05-19 DIAGNOSIS — E782 Mixed hyperlipidemia: Secondary | ICD-10-CM | POA: Diagnosis not present

## 2019-05-19 DIAGNOSIS — R7301 Impaired fasting glucose: Secondary | ICD-10-CM | POA: Diagnosis not present

## 2019-05-19 DIAGNOSIS — E038 Other specified hypothyroidism: Secondary | ICD-10-CM | POA: Diagnosis not present

## 2019-05-26 DIAGNOSIS — R82998 Other abnormal findings in urine: Secondary | ICD-10-CM | POA: Diagnosis not present

## 2019-05-26 DIAGNOSIS — I129 Hypertensive chronic kidney disease with stage 1 through stage 4 chronic kidney disease, or unspecified chronic kidney disease: Secondary | ICD-10-CM | POA: Diagnosis not present

## 2019-05-26 DIAGNOSIS — E782 Mixed hyperlipidemia: Secondary | ICD-10-CM | POA: Diagnosis not present

## 2019-05-26 DIAGNOSIS — R7301 Impaired fasting glucose: Secondary | ICD-10-CM | POA: Diagnosis not present

## 2019-05-26 DIAGNOSIS — K219 Gastro-esophageal reflux disease without esophagitis: Secondary | ICD-10-CM | POA: Diagnosis not present

## 2019-05-26 DIAGNOSIS — Z0000 Encounter for general adult medical examination without abnormal findings: Secondary | ICD-10-CM | POA: Diagnosis not present

## 2019-05-26 DIAGNOSIS — E669 Obesity, unspecified: Secondary | ICD-10-CM | POA: Diagnosis not present

## 2019-05-26 DIAGNOSIS — E039 Hypothyroidism, unspecified: Secondary | ICD-10-CM | POA: Diagnosis not present

## 2019-05-26 DIAGNOSIS — R55 Syncope and collapse: Secondary | ICD-10-CM | POA: Diagnosis not present

## 2019-05-26 DIAGNOSIS — N1831 Chronic kidney disease, stage 3a: Secondary | ICD-10-CM | POA: Diagnosis not present

## 2019-07-07 DIAGNOSIS — Z1212 Encounter for screening for malignant neoplasm of rectum: Secondary | ICD-10-CM | POA: Diagnosis not present

## 2019-08-31 DIAGNOSIS — N281 Cyst of kidney, acquired: Secondary | ICD-10-CM | POA: Diagnosis not present

## 2019-08-31 DIAGNOSIS — N3020 Other chronic cystitis without hematuria: Secondary | ICD-10-CM | POA: Diagnosis not present

## 2019-08-31 DIAGNOSIS — N200 Calculus of kidney: Secondary | ICD-10-CM | POA: Diagnosis not present

## 2019-10-16 IMAGING — US US ABDOMEN COMPLETE
1 series · 13 of 25 positions shown · non-contrast
Comparison: CT abdomen and pelvis 08/09/2014

CLINICAL DATA: Epigastric abdominal pain 1 month, history kidney
stones

EXAM:
ABDOMEN ULTRASOUND COMPLETE

[Series 1: us abdomen complete · 0.18mm/px · 13 of 122 slices shown]
[im 1/122]
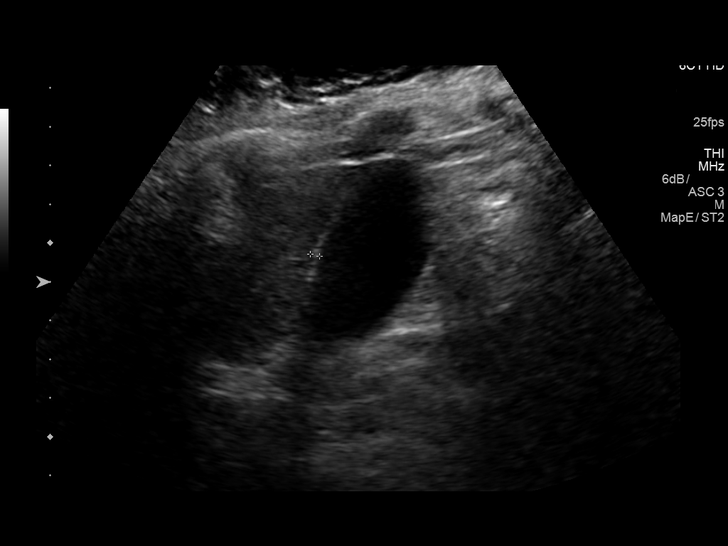
[im 11/122]
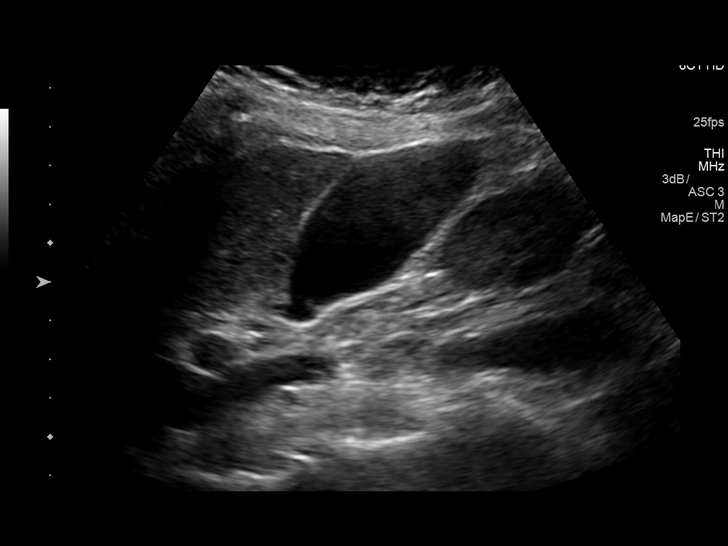
[im 21/122]
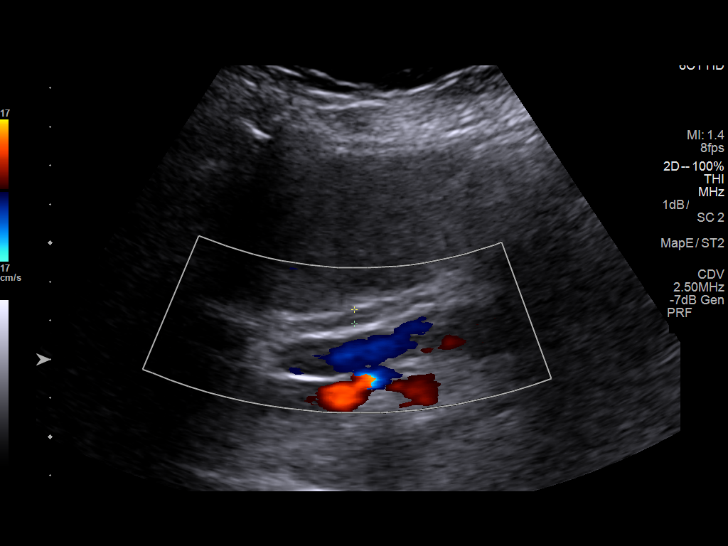
[im 31/122]
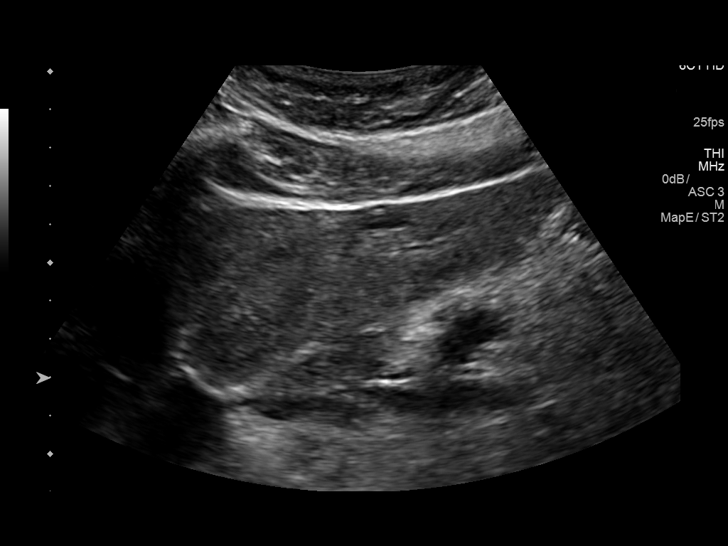
[im 41/122]
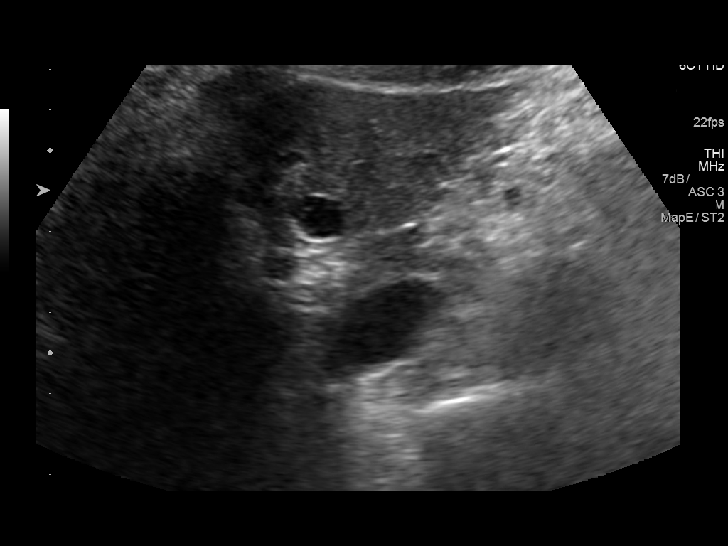
[im 51/122]
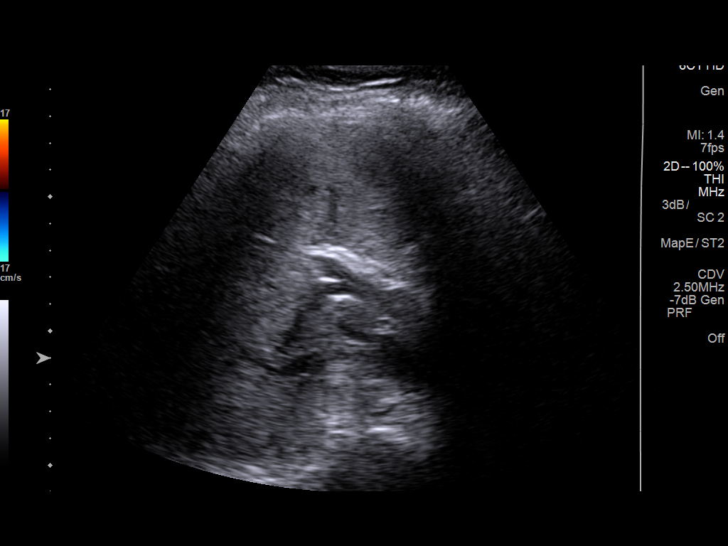
[im 61/122]
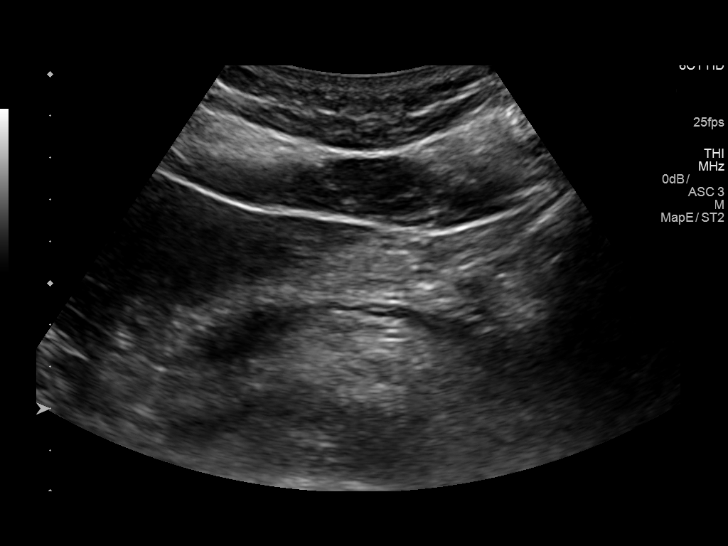
[im 71/122]
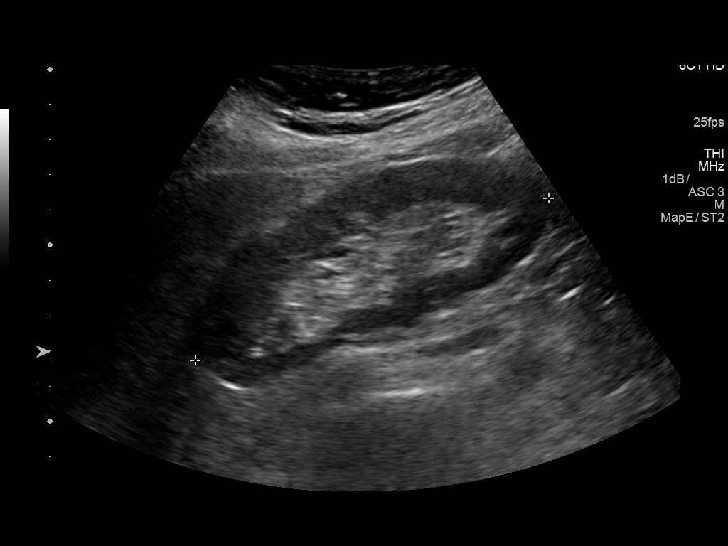
[im 81/122]
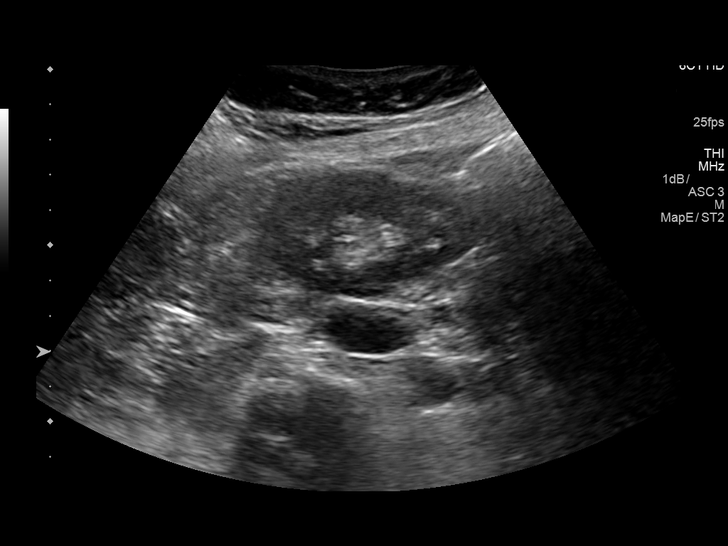
[im 91/122]
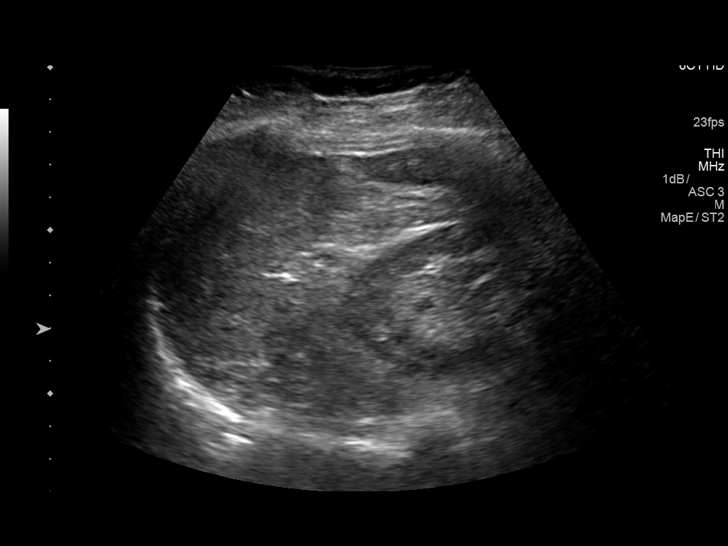
[im 101/122]
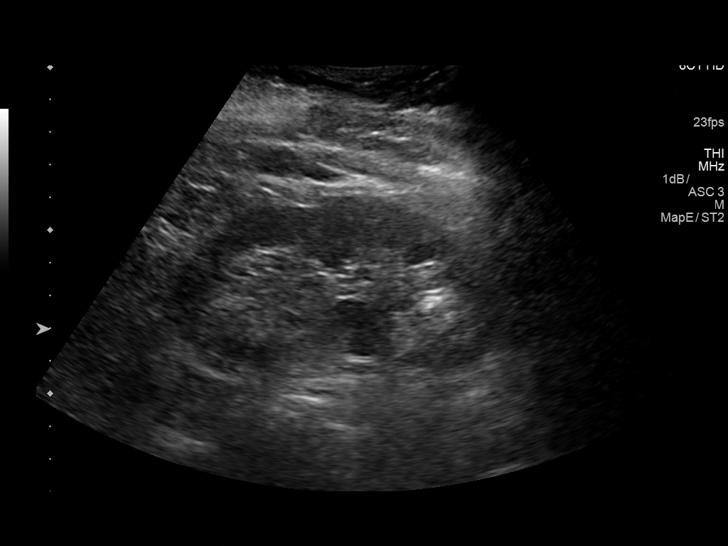
[im 111/122]
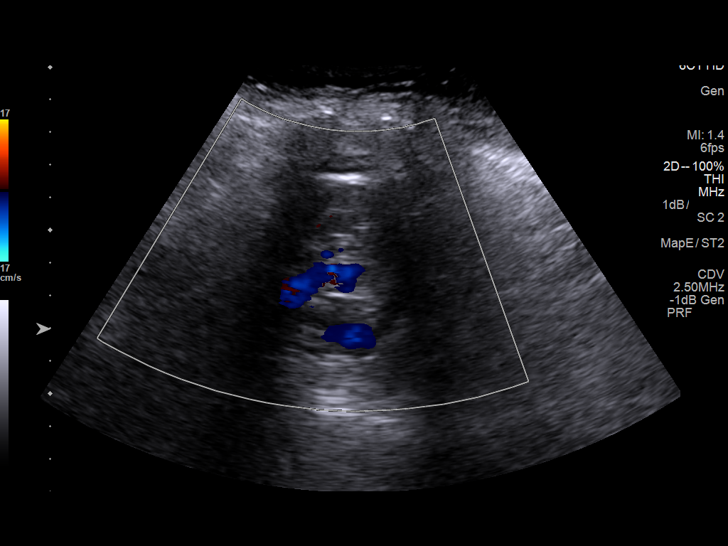
[im 122/122]
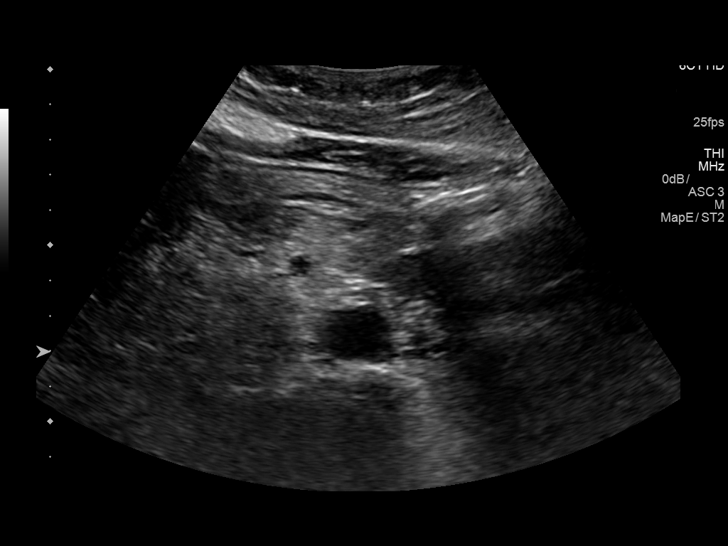

[13 of 25 positions shown; findings below may reference images not displayed]

FINDINGS: Gallbladder: Normally distended without stones or wall thickening.
No pericholecystic fluid or sonographic Murphy sign.

Common bile duct: Diameter: 4 mm, normal

Liver: Echogenic parenchyma, likely fatty infiltration though this
can be seen with cirrhosis and certain infiltrative disorders.
Hypoechoic nodule LEFT lobe 10 x 5 x 8 mm, nonspecific, could
represent a small solid nodule or a complex cyst. Additional small
hypoechoic nodule LEFT lobe near porta hepatis 13 x 9 x 11 mm,
likely a minimally complicated cyst. Portal vein is patent on color
Doppler imaging with normal direction of blood flow towards the
liver.

IVC: Normal appearance

Pancreas: Normal appearance

Spleen: Normal appearance, 6.0 cm length

Right Kidney: Length: 11.0 cm. Normal cortical thickness and
echogenicity. Small peripelvic renal cyst 2.2 x 1.8 x 1.8 cm. No
additional mass or hydronephrosis.

Left Kidney: Length: 10.4 cm. Question calculus at upper pole 10 mm
diameter an additional 7 mm question calculus at lower pole, both
nonobstructing. No mass or hydronephrosis.

Abdominal aorta: Normal caliber

Other findings: No free fluid
IMPRESSION: Question 2 nonobstructing LEFT renal calculi.

Small RIGHT renal cyst.

Small hypoechoic nodule LEFT lobe liver likely 13 mm complex cyst
with an additional 10 mm nonspecific hypoechoic nodule in lateral
segment LEFT lobe anteriorly; both of these lesions correspond to
small cysts on a prior CT exam.

No additional upper abdominal sonographic abnormalities.

## 2019-11-22 DIAGNOSIS — K219 Gastro-esophageal reflux disease without esophagitis: Secondary | ICD-10-CM | POA: Diagnosis not present

## 2019-11-22 DIAGNOSIS — R1030 Lower abdominal pain, unspecified: Secondary | ICD-10-CM | POA: Diagnosis not present

## 2019-11-22 DIAGNOSIS — R1310 Dysphagia, unspecified: Secondary | ICD-10-CM | POA: Diagnosis not present

## 2020-02-24 DIAGNOSIS — R102 Pelvic and perineal pain: Secondary | ICD-10-CM | POA: Diagnosis not present

## 2020-02-24 DIAGNOSIS — N3020 Other chronic cystitis without hematuria: Secondary | ICD-10-CM | POA: Diagnosis not present

## 2020-02-24 DIAGNOSIS — R311 Benign essential microscopic hematuria: Secondary | ICD-10-CM | POA: Diagnosis not present

## 2020-02-24 DIAGNOSIS — N200 Calculus of kidney: Secondary | ICD-10-CM | POA: Diagnosis not present

## 2020-03-29 DIAGNOSIS — Z23 Encounter for immunization: Secondary | ICD-10-CM | POA: Diagnosis not present

## 2020-04-14 DIAGNOSIS — Z23 Encounter for immunization: Secondary | ICD-10-CM | POA: Diagnosis not present

## 2020-04-26 DIAGNOSIS — Z1231 Encounter for screening mammogram for malignant neoplasm of breast: Secondary | ICD-10-CM | POA: Diagnosis not present

## 2020-05-24 DIAGNOSIS — R7301 Impaired fasting glucose: Secondary | ICD-10-CM | POA: Diagnosis not present

## 2020-05-24 DIAGNOSIS — E782 Mixed hyperlipidemia: Secondary | ICD-10-CM | POA: Diagnosis not present

## 2020-05-24 DIAGNOSIS — E039 Hypothyroidism, unspecified: Secondary | ICD-10-CM | POA: Diagnosis not present

## 2020-05-29 DIAGNOSIS — R7301 Impaired fasting glucose: Secondary | ICD-10-CM | POA: Diagnosis not present

## 2020-05-29 DIAGNOSIS — Z1339 Encounter for screening examination for other mental health and behavioral disorders: Secondary | ICD-10-CM | POA: Diagnosis not present

## 2020-05-29 DIAGNOSIS — I129 Hypertensive chronic kidney disease with stage 1 through stage 4 chronic kidney disease, or unspecified chronic kidney disease: Secondary | ICD-10-CM | POA: Diagnosis not present

## 2020-05-29 DIAGNOSIS — K219 Gastro-esophageal reflux disease without esophagitis: Secondary | ICD-10-CM | POA: Diagnosis not present

## 2020-05-29 DIAGNOSIS — Z0000 Encounter for general adult medical examination without abnormal findings: Secondary | ICD-10-CM | POA: Diagnosis not present

## 2020-05-29 DIAGNOSIS — Z1331 Encounter for screening for depression: Secondary | ICD-10-CM | POA: Diagnosis not present

## 2020-05-29 DIAGNOSIS — I10 Essential (primary) hypertension: Secondary | ICD-10-CM | POA: Diagnosis not present

## 2020-05-29 DIAGNOSIS — R82998 Other abnormal findings in urine: Secondary | ICD-10-CM | POA: Diagnosis not present

## 2020-05-29 DIAGNOSIS — E039 Hypothyroidism, unspecified: Secondary | ICD-10-CM | POA: Diagnosis not present

## 2020-05-29 DIAGNOSIS — E782 Mixed hyperlipidemia: Secondary | ICD-10-CM | POA: Diagnosis not present

## 2020-05-29 DIAGNOSIS — E669 Obesity, unspecified: Secondary | ICD-10-CM | POA: Diagnosis not present

## 2020-08-21 DIAGNOSIS — H2513 Age-related nuclear cataract, bilateral: Secondary | ICD-10-CM | POA: Diagnosis not present

## 2020-08-29 DIAGNOSIS — N3020 Other chronic cystitis without hematuria: Secondary | ICD-10-CM | POA: Diagnosis not present

## 2020-08-29 DIAGNOSIS — R1084 Generalized abdominal pain: Secondary | ICD-10-CM | POA: Diagnosis not present

## 2020-08-29 DIAGNOSIS — R310 Gross hematuria: Secondary | ICD-10-CM | POA: Diagnosis not present

## 2020-08-29 DIAGNOSIS — N200 Calculus of kidney: Secondary | ICD-10-CM | POA: Diagnosis not present

## 2020-09-08 DIAGNOSIS — R310 Gross hematuria: Secondary | ICD-10-CM | POA: Diagnosis not present

## 2020-09-13 DIAGNOSIS — N281 Cyst of kidney, acquired: Secondary | ICD-10-CM | POA: Diagnosis not present

## 2020-09-13 DIAGNOSIS — R310 Gross hematuria: Secondary | ICD-10-CM | POA: Diagnosis not present

## 2020-09-13 DIAGNOSIS — N200 Calculus of kidney: Secondary | ICD-10-CM | POA: Diagnosis not present

## 2020-09-14 DIAGNOSIS — N200 Calculus of kidney: Secondary | ICD-10-CM | POA: Diagnosis not present

## 2020-09-14 DIAGNOSIS — N3020 Other chronic cystitis without hematuria: Secondary | ICD-10-CM | POA: Diagnosis not present

## 2020-09-14 DIAGNOSIS — R310 Gross hematuria: Secondary | ICD-10-CM | POA: Diagnosis not present

## 2020-09-25 ENCOUNTER — Other Ambulatory Visit: Payer: Self-pay | Admitting: Internal Medicine

## 2020-09-25 ENCOUNTER — Other Ambulatory Visit (HOSPITAL_COMMUNITY): Payer: Self-pay | Admitting: Internal Medicine

## 2020-09-25 DIAGNOSIS — R911 Solitary pulmonary nodule: Secondary | ICD-10-CM

## 2020-10-03 DIAGNOSIS — R911 Solitary pulmonary nodule: Secondary | ICD-10-CM | POA: Diagnosis not present

## 2020-10-10 ENCOUNTER — Ambulatory Visit (HOSPITAL_COMMUNITY): Payer: Medicare Other

## 2020-10-10 ENCOUNTER — Encounter (HOSPITAL_COMMUNITY): Payer: Self-pay

## 2020-11-13 DIAGNOSIS — R911 Solitary pulmonary nodule: Secondary | ICD-10-CM | POA: Diagnosis not present

## 2020-11-13 DIAGNOSIS — L989 Disorder of the skin and subcutaneous tissue, unspecified: Secondary | ICD-10-CM | POA: Diagnosis not present

## 2020-11-13 DIAGNOSIS — D1779 Benign lipomatous neoplasm of other sites: Secondary | ICD-10-CM | POA: Diagnosis not present

## 2020-11-29 DIAGNOSIS — D1779 Benign lipomatous neoplasm of other sites: Secondary | ICD-10-CM | POA: Diagnosis not present

## 2020-11-29 DIAGNOSIS — I129 Hypertensive chronic kidney disease with stage 1 through stage 4 chronic kidney disease, or unspecified chronic kidney disease: Secondary | ICD-10-CM | POA: Diagnosis not present

## 2020-11-29 DIAGNOSIS — E669 Obesity, unspecified: Secondary | ICD-10-CM | POA: Diagnosis not present

## 2020-11-29 DIAGNOSIS — N1831 Chronic kidney disease, stage 3a: Secondary | ICD-10-CM | POA: Diagnosis not present

## 2020-11-29 DIAGNOSIS — R7301 Impaired fasting glucose: Secondary | ICD-10-CM | POA: Diagnosis not present

## 2020-12-13 ENCOUNTER — Ambulatory Visit: Payer: Self-pay | Admitting: Surgery

## 2020-12-13 DIAGNOSIS — R229 Localized swelling, mass and lump, unspecified: Secondary | ICD-10-CM | POA: Diagnosis not present

## 2020-12-13 HISTORY — DX: Localized swelling, mass and lump, unspecified: R22.9

## 2020-12-13 NOTE — H&P (Signed)
Joanna Johnston Appointment: 12/13/2020 10:30 AM Location: [facility] Patient #: [id number] DOB: [birth year] Married / Language: [name] / Race: White Female  History of Present Illness (Rhyan Radler A. Kae Heller MD; 12/13/2020 11:59 AM) Patient words: This is a very pleasant 80 year old woman with a mass on her mid back. This has been present for a few weeks. It is uncomfortable when anything rubs in the area for example the car seat when she is driving. She thinks it is growing. No prior procedures in this area.  The patient is a 80 year old female.   Past Surgical History Janeann Forehand, CNA; 12/13/2020 10:23 AM) Breast Biopsy Left. Foot Surgery Right. Hysterectomy (not due to cancer) - Complete Thyroid Surgery  Diagnostic Studies History Janeann Forehand, CNA; 12/13/2020 10:23 AM) Colonoscopy 1-5 years ago Mammogram within last year Pap Smear >5 years ago  Allergies Janeann Forehand, CNA; 12/13/2020 10:23 AM) Codeine Phosphate *ANALGESICS - OPIOID* Allergies Reconciled  Medication History Janeann Forehand, CNA; 12/13/2020 10:23 AM) Azithromycin (250MG  Tablet, Oral) Active. Cephalexin (500MG  Capsule, Oral) Active. Ciprofloxacin HCl (500MG  Tablet, Oral) Active. Levothyroxine Sodium (75MCG Tablet, Oral) Active. Lidocaine Viscous HCl (2% Solution, Mouth/Throat) Active. Nitrofurantoin Macrocrystal (100MG  Capsule, Oral) Active. Pantoprazole Sodium (40MG  Tablet DR, Oral) Active. Rosuvastatin Calcium (40MG  Cap Sprinkle, Oral) Active. Rosuvastatin Calcium (20MG  Tablet, Oral) Active. Sucralfate (1GM Tablet, Oral) Active. Tamsulosin HCl (0.4MG  Capsule, Oral) Active. tiZANidine HCl (6MG  Capsule, Oral) Active. tiZANidine HCl (4MG  Tablet, Oral) Active. Medications Reconciled  Social History Janeann Forehand, CNA; 12/13/2020 10:23 AM) Alcohol use Moderate alcohol use. Caffeine use Coffee. No drug use Tobacco use Never smoker.  Family History Janeann Forehand, CNA; 12/13/2020 10:23 AM) Cerebrovascular Accident Mother. Colon Cancer Father. Diabetes Mellitus Sister. Malignant Neoplasm Of Pancreas Sister.  Pregnancy / Birth History Janeann Forehand, CNA; 12/13/2020 10:23 AM) Age at menarche 85 years. Age of menopause <47 Gravida 2 Maternal age 62-20 Para 2  Other Problems Janeann Forehand, CNA; 12/13/2020 10:23 AM) Diverticulosis Gastroesophageal Reflux Disease Hemorrhoids Hypercholesterolemia Kidney Stone Oophorectomy Bilateral. Thyroid Disease     Review of Systems (Megham Dwyer A. Kae Heller MD; 12/13/2020 11:59 AM) General Present- Night Sweats. Not Present- Appetite Loss, Chills, Fatigue, Fever, Weight Gain and Weight Loss. Skin Present- New Lesions. Not Present- Change in Wart/Mole, Dryness, Hives, Jaundice, Non-Healing Wounds, Rash and Ulcer. HEENT Present- Wears glasses/contact lenses. Not Present- Earache, Hearing Loss, Hoarseness, Nose Bleed, Oral Ulcers, Ringing in the Ears, Seasonal Allergies, Sinus Pain, Sore Throat, Visual Disturbances and Yellow Eyes. Respiratory Not Present- Bloody sputum, Chronic Cough, Difficulty Breathing, Snoring and Wheezing. Breast Not Present- Breast Mass, Breast Pain, Nipple Discharge and Skin Changes. Cardiovascular Not Present- Chest Pain, Difficulty Breathing Lying Down, Leg Cramps, Palpitations, Rapid Heart Rate, Shortness of Breath and Swelling of Extremities. Gastrointestinal Present- Hemorrhoids. Not Present- Abdominal Pain, Bloating, Bloody Stool, Change in Bowel Habits, Chronic diarrhea, Constipation, Difficulty Swallowing, Excessive gas, Gets full quickly at meals, Indigestion, Nausea, Rectal Pain and Vomiting. Female Genitourinary Not Present- Frequency, Nocturia, Painful Urination, Pelvic Pain and Urgency. Musculoskeletal Not Present- Back Pain, Joint Pain, Joint Stiffness, Muscle Pain, Muscle Weakness and Swelling of Extremities. Neurological Not Present- Decreased Memory,  Fainting, Headaches, Numbness, Seizures, Tingling, Tremor, Trouble walking and Weakness. Psychiatric Not Present- Anxiety, Bipolar, Change in Sleep Pattern, Depression, Fearful and Frequent crying. Endocrine Not Present- Cold Intolerance, Excessive Hunger, Hair Changes, Heat Intolerance, Hot flashes and New Diabetes. Hematology Not Present- Blood Thinners, Easy Bruising, Excessive bleeding, Gland problems, HIV and Persistent Infections. All other systems negative  Vitals (Donyelle Toney Rakes  CNA; 12/13/2020 10:24 AM) 12/13/2020 10:23 AM Weight: 199.5 lb Height: 66in Body Surface Area: 2 m Body Mass Index: 32.2 kg/m  Temp.: 98.54F  Pulse: 90 (Regular)  P.OX: 97% (Room air) BP: 136/70(Sitting, Left Arm, Standard)        Physical Exam (Tip Atienza A. Averly Ericson MD; 12/13/2020 11:59 AM)  The physical exam findings are as follows: Note:Alert, well-appearing On the respirations On the mid back there is a approximately 3 cm mobile subcutaneous mass consistent with lipoma. She also notes a 1 cm lesion on her right forearm that she would like removed.    Assessment & Plan (Grethel Zenk A. Kae Heller MD; 12/13/2020 12:00 PM)  SUBCUTANEOUS MASS (R22.9) Story: Mid back and right forearm. Desires excision. Discussed procedure, risks of bleeding, infection, pain, scarring, recurrence of the lesions, wound healing problems. Questions were answered. Schedule at her convenience.

## 2020-12-13 NOTE — H&P (View-Only) (Signed)
Clementeen Graham Appointment: 12/13/2020 10:30 AM Location: [facility] Patient #: [id number] DOB: 12-29-1940 Married / Language: [name] / Race: White Female  History of Present Illness (Joanna Johnston A. Kae Heller MD; 12/13/2020 11:59 AM) Patient words: This is a very pleasant 80 year old woman with a mass on her mid back. This has been present for a few weeks. It is uncomfortable when anything rubs in the area for example the car seat when she is driving. She thinks it is growing. No prior procedures in this area.  The patient is a 80 year old female.   Past Surgical History Janeann Forehand, CNA; 12/13/2020 10:23 AM) Breast Biopsy Left. Foot Surgery Right. Hysterectomy (not due to cancer) - Complete Thyroid Surgery  Diagnostic Studies History Janeann Forehand, CNA; 12/13/2020 10:23 AM) Colonoscopy 1-5 years ago Mammogram within last year Pap Smear >5 years ago  Allergies Janeann Forehand, CNA; 12/13/2020 10:23 AM) Codeine Phosphate *ANALGESICS - OPIOID* Allergies Reconciled  Medication History Janeann Forehand, CNA; 12/13/2020 10:23 AM) Azithromycin (250MG  Tablet, Oral) Active. Cephalexin (500MG  Capsule, Oral) Active. Ciprofloxacin HCl (500MG  Tablet, Oral) Active. Levothyroxine Sodium (75MCG Tablet, Oral) Active. Lidocaine Viscous HCl (2% Solution, Mouth/Throat) Active. Nitrofurantoin Macrocrystal (100MG  Capsule, Oral) Active. Pantoprazole Sodium (40MG  Tablet DR, Oral) Active. Rosuvastatin Calcium (40MG  Cap Sprinkle, Oral) Active. Rosuvastatin Calcium (20MG  Tablet, Oral) Active. Sucralfate (1GM Tablet, Oral) Active. Tamsulosin HCl (0.4MG  Capsule, Oral) Active. tiZANidine HCl (6MG  Capsule, Oral) Active. tiZANidine HCl (4MG  Tablet, Oral) Active. Medications Reconciled  Social History Janeann Forehand, CNA; 12/13/2020 10:23 AM) Alcohol use Moderate alcohol use. Caffeine use Coffee. No drug use Tobacco use Never smoker.  Family History Janeann Forehand, CNA; 12/13/2020 10:23 AM) Cerebrovascular Accident Mother. Colon Cancer Father. Diabetes Mellitus Sister. Malignant Neoplasm Of Pancreas Sister.  Pregnancy / Birth History Janeann Forehand, CNA; 12/13/2020 10:23 AM) Age at menarche 57 years. Age of menopause <47 Gravida 2 Maternal age 92-20 Para 2  Other Problems Janeann Forehand, CNA; 12/13/2020 10:23 AM) Diverticulosis Gastroesophageal Reflux Disease Hemorrhoids Hypercholesterolemia Kidney Stone Oophorectomy Bilateral. Thyroid Disease     Review of Systems (Natalina Wieting A. Kae Heller MD; 12/13/2020 11:59 AM) General Present- Night Sweats. Not Present- Appetite Loss, Chills, Fatigue, Fever, Weight Gain and Weight Loss. Skin Present- New Lesions. Not Present- Change in Wart/Mole, Dryness, Hives, Jaundice, Non-Healing Wounds, Rash and Ulcer. HEENT Present- Wears glasses/contact lenses. Not Present- Earache, Hearing Loss, Hoarseness, Nose Bleed, Oral Ulcers, Ringing in the Ears, Seasonal Allergies, Sinus Pain, Sore Throat, Visual Disturbances and Yellow Eyes. Respiratory Not Present- Bloody sputum, Chronic Cough, Difficulty Breathing, Snoring and Wheezing. Breast Not Present- Breast Mass, Breast Pain, Nipple Discharge and Skin Changes. Cardiovascular Not Present- Chest Pain, Difficulty Breathing Lying Down, Leg Cramps, Palpitations, Rapid Heart Rate, Shortness of Breath and Swelling of Extremities. Gastrointestinal Present- Hemorrhoids. Not Present- Abdominal Pain, Bloating, Bloody Stool, Change in Bowel Habits, Chronic diarrhea, Constipation, Difficulty Swallowing, Excessive gas, Gets full quickly at meals, Indigestion, Nausea, Rectal Pain and Vomiting. Female Genitourinary Not Present- Frequency, Nocturia, Painful Urination, Pelvic Pain and Urgency. Musculoskeletal Not Present- Back Pain, Joint Pain, Joint Stiffness, Muscle Pain, Muscle Weakness and Swelling of Extremities. Neurological Not Present- Decreased Memory,  Fainting, Headaches, Numbness, Seizures, Tingling, Tremor, Trouble walking and Weakness. Psychiatric Not Present- Anxiety, Bipolar, Change in Sleep Pattern, Depression, Fearful and Frequent crying. Endocrine Not Present- Cold Intolerance, Excessive Hunger, Hair Changes, Heat Intolerance, Hot flashes and New Diabetes. Hematology Not Present- Blood Thinners, Easy Bruising, Excessive bleeding, Gland problems, HIV and Persistent Infections. All other systems negative  Vitals (Donyelle Toney Rakes  CNA; 12/13/2020 10:24 AM) 12/13/2020 10:23 AM Weight: 199.5 lb Height: 66in Body Surface Area: 2 m Body Mass Index: 32.2 kg/m  Temp.: 98.54F  Pulse: 90 (Regular)  P.OX: 97% (Room air) BP: 136/70(Sitting, Left Arm, Standard)        Physical Exam (Manish Ruggiero A. Maleiya Pergola MD; 12/13/2020 11:59 AM)  The physical exam findings are as follows: Note:Alert, well-appearing On the respirations On the mid back there is a approximately 3 cm mobile subcutaneous mass consistent with lipoma. She also notes a 1 cm lesion on her right forearm that she would like removed.    Assessment & Plan (Toshiye Kever A. Kae Heller MD; 12/13/2020 12:00 PM)  SUBCUTANEOUS MASS (R22.9) Story: Mid back and right forearm. Desires excision. Discussed procedure, risks of bleeding, infection, pain, scarring, recurrence of the lesions, wound healing problems. Questions were answered. Schedule at her convenience.

## 2020-12-28 ENCOUNTER — Encounter (HOSPITAL_BASED_OUTPATIENT_CLINIC_OR_DEPARTMENT_OTHER): Payer: Self-pay | Admitting: Surgery

## 2020-12-28 ENCOUNTER — Other Ambulatory Visit: Payer: Self-pay

## 2020-12-28 NOTE — Progress Notes (Signed)
Spoke w/ via phone for pre-op interview--- Pt Lab needs dos----   no            Lab results------ no COVID test -----patient states asymptomatic no test needed Arrive at ------- 1115 on 01-03-2021 NPO after MN NO Solid Food.  Clear liquids from MN until--- 1015 Med rec completed Medications to take morning of surgery ----- NONE Diabetic medication ----- n/a Patient instructed no nail polish to be worn day of surgery Patient instructed to bring photo id and insurance card day of surgery Patient aware to have Driver (ride ) / caregiver for 24 hours after surgery -- husband, Sonia Side Patient Special Instructions ----- n/a Pre-Op special Istructions ----- n/a Patient verbalized understanding of instructions that were given at this phone interview. Patient denies shortness of breath, chest pain, fever, cough at this phone interview.

## 2021-01-03 ENCOUNTER — Ambulatory Visit (HOSPITAL_BASED_OUTPATIENT_CLINIC_OR_DEPARTMENT_OTHER): Payer: Medicare Other | Admitting: Certified Registered"

## 2021-01-03 ENCOUNTER — Ambulatory Visit (HOSPITAL_BASED_OUTPATIENT_CLINIC_OR_DEPARTMENT_OTHER)
Admission: RE | Admit: 2021-01-03 | Discharge: 2021-01-03 | Disposition: A | Discharge status: Home / Self Care | Payer: Medicare Other | Attending: Surgery | Admitting: Surgery

## 2021-01-03 ENCOUNTER — Encounter (HOSPITAL_BASED_OUTPATIENT_CLINIC_OR_DEPARTMENT_OTHER)
Admission: RE | Disposition: A | Discharge status: Home / Self Care | Payer: Self-pay | Source: Home / Self Care | Attending: Surgery

## 2021-01-03 ENCOUNTER — Encounter (HOSPITAL_BASED_OUTPATIENT_CLINIC_OR_DEPARTMENT_OTHER): Payer: Self-pay | Admitting: Surgery

## 2021-01-03 DIAGNOSIS — R222 Localized swelling, mass and lump, trunk: Secondary | ICD-10-CM | POA: Diagnosis not present

## 2021-01-03 DIAGNOSIS — Z79899 Other long term (current) drug therapy: Secondary | ICD-10-CM | POA: Diagnosis not present

## 2021-01-03 DIAGNOSIS — M7989 Other specified soft tissue disorders: Secondary | ICD-10-CM | POA: Diagnosis not present

## 2021-01-03 DIAGNOSIS — Z79890 Hormone replacement therapy (postmenopausal): Secondary | ICD-10-CM | POA: Diagnosis not present

## 2021-01-03 DIAGNOSIS — E039 Hypothyroidism, unspecified: Secondary | ICD-10-CM | POA: Diagnosis not present

## 2021-01-03 DIAGNOSIS — E785 Hyperlipidemia, unspecified: Secondary | ICD-10-CM | POA: Diagnosis not present

## 2021-01-03 DIAGNOSIS — D179 Benign lipomatous neoplasm, unspecified: Secondary | ICD-10-CM | POA: Diagnosis not present

## 2021-01-03 DIAGNOSIS — D171 Benign lipomatous neoplasm of skin and subcutaneous tissue of trunk: Secondary | ICD-10-CM | POA: Diagnosis not present

## 2021-01-03 DIAGNOSIS — Z885 Allergy status to narcotic agent status: Secondary | ICD-10-CM | POA: Insufficient documentation

## 2021-01-03 DIAGNOSIS — D1721 Benign lipomatous neoplasm of skin and subcutaneous tissue of right arm: Secondary | ICD-10-CM | POA: Diagnosis not present

## 2021-01-03 DIAGNOSIS — R2231 Localized swelling, mass and lump, right upper limb: Secondary | ICD-10-CM | POA: Diagnosis not present

## 2021-01-03 HISTORY — DX: Calculus of kidney: N20.0

## 2021-01-03 HISTORY — DX: Hyperlipidemia, unspecified: E78.5

## 2021-01-03 HISTORY — DX: Diverticulosis of large intestine without perforation or abscess without bleeding: K57.30

## 2021-01-03 HISTORY — DX: Presence of dental prosthetic device (complete) (partial): Z97.2

## 2021-01-03 HISTORY — DX: Presence of spectacles and contact lenses: Z97.3

## 2021-01-03 HISTORY — DX: Personal history of other diseases of the digestive system: Z87.19

## 2021-01-03 HISTORY — PX: LIPOMA EXCISION: SHX5283

## 2021-01-03 HISTORY — DX: Other chronic cystitis without hematuria: N30.20

## 2021-01-03 HISTORY — DX: Personal history of other infectious and parasitic diseases: Z86.19

## 2021-01-03 HISTORY — DX: Diaphragmatic hernia without obstruction or gangrene: K44.9

## 2021-01-03 HISTORY — DX: Other constipation: K59.09

## 2021-01-03 SURGERY — EXCISION LIPOMA
Anesthesia: Monitor Anesthesia Care | Site: Back | Laterality: Right

## 2021-01-03 MED ORDER — 0.9 % SODIUM CHLORIDE (POUR BTL) OPTIME
TOPICAL | Status: DC | PRN
  Administered 2021-01-03: 500 mL

## 2021-01-03 MED ORDER — PROPOFOL 10 MG/ML IV BOLUS
INTRAVENOUS | Status: AC
  Filled 2021-01-03: qty 20

## 2021-01-03 MED ORDER — ONDANSETRON HCL 4 MG/2ML IJ SOLN
INTRAMUSCULAR | Status: AC
  Filled 2021-01-03: qty 2

## 2021-01-03 MED ORDER — CEFAZOLIN SODIUM-DEXTROSE 2-4 GM/100ML-% IV SOLN: 2.0000 g | INTRAVENOUS | Status: DC

## 2021-01-03 MED ORDER — LACTATED RINGERS IV SOLN: INTRAVENOUS | Status: DC

## 2021-01-03 MED ORDER — SODIUM CHLORIDE 0.9 % IV SOLN: 250.0000 mL | INTRAVENOUS | Status: DC | PRN

## 2021-01-03 MED ORDER — FENTANYL CITRATE (PF) 100 MCG/2ML IJ SOLN: 25.0000 ug | INTRAMUSCULAR | Status: DC | PRN

## 2021-01-03 MED ORDER — BUPIVACAINE-EPINEPHRINE 0.25% -1:200000 IJ SOLN
INTRAMUSCULAR | Status: DC | PRN
  Administered 2021-01-03: 14 mL

## 2021-01-03 MED ORDER — CHLORHEXIDINE GLUCONATE CLOTH 2 % EX PADS: 6.0000 | MEDICATED_PAD | Freq: Once | CUTANEOUS | Status: DC

## 2021-01-03 MED ORDER — PROPOFOL 500 MG/50ML IV EMUL
INTRAVENOUS | Status: DC | PRN
  Administered 2021-01-03: 125 ug/kg/min via INTRAVENOUS

## 2021-01-03 MED ORDER — FENTANYL CITRATE (PF) 100 MCG/2ML IJ SOLN
INTRAMUSCULAR | Status: AC
  Filled 2021-01-03: qty 2

## 2021-01-03 MED ORDER — PROPOFOL 500 MG/50ML IV EMUL
INTRAVENOUS | Status: AC
  Filled 2021-01-03: qty 50

## 2021-01-03 MED ORDER — PROPOFOL 10 MG/ML IV BOLUS
INTRAVENOUS | Status: DC | PRN
  Administered 2021-01-03: 50 mg via INTRAVENOUS

## 2021-01-03 MED ORDER — OXYCODONE HCL 5 MG PO TABS: 5.0000 mg | ORAL_TABLET | ORAL | Status: DC | PRN

## 2021-01-03 MED ORDER — CEFAZOLIN SODIUM-DEXTROSE 2-4 GM/100ML-% IV SOLN
INTRAVENOUS | Status: AC
  Filled 2021-01-03: qty 100

## 2021-01-03 MED ORDER — SODIUM CHLORIDE 0.9% FLUSH: 3.0000 mL | INTRAVENOUS | Status: DC | PRN

## 2021-01-03 MED ORDER — ACETAMINOPHEN 500 MG PO TABS
1000.0000 mg | ORAL_TABLET | ORAL | Status: AC
  Administered 2021-01-03: 1000 mg via ORAL

## 2021-01-03 MED ORDER — ACETAMINOPHEN 325 MG PO TABS: 650.0000 mg | ORAL_TABLET | ORAL | Status: DC | PRN

## 2021-01-03 MED ORDER — ACETAMINOPHEN 325 MG RE SUPP: 650.0000 mg | RECTAL | Status: DC | PRN

## 2021-01-03 MED ORDER — SODIUM CHLORIDE 0.9% FLUSH: 3.0000 mL | Freq: Two times a day (BID) | INTRAVENOUS | Status: DC

## 2021-01-03 MED ORDER — ACETAMINOPHEN 500 MG PO TABS
ORAL_TABLET | ORAL | Status: AC
  Filled 2021-01-03: qty 2

## 2021-01-03 MED ORDER — ONDANSETRON HCL 4 MG/2ML IJ SOLN
INTRAMUSCULAR | Status: DC | PRN
  Administered 2021-01-03: 4 mg via INTRAVENOUS

## 2021-01-03 MED ORDER — PHENYLEPHRINE HCL (PRESSORS) 10 MG/ML IV SOLN
INTRAVENOUS | Status: DC | PRN
  Administered 2021-01-03 (×4): 100 ug via INTRAVENOUS

## 2021-01-03 MED ORDER — FENTANYL CITRATE (PF) 100 MCG/2ML IJ SOLN
INTRAMUSCULAR | Status: DC | PRN
  Administered 2021-01-03 (×2): 50 ug via INTRAVENOUS

## 2021-01-03 MED ORDER — TRAMADOL HCL 50 MG PO TABS: 50.0000 mg | ORAL_TABLET | Freq: Four times a day (QID) | ORAL | 0 refills | Status: AC | PRN

## 2021-01-03 MED ORDER — PROMETHAZINE HCL 25 MG/ML IJ SOLN: 6.2500 mg | INTRAMUSCULAR | Status: DC | PRN

## 2021-01-03 SURGICAL SUPPLY — 51 items
ADH SKN CLS APL DERMABOND .7 (GAUZE/BANDAGES/DRESSINGS)
APL PRP STRL LF DISP 70% ISPRP (MISCELLANEOUS)
APL SKNCLS STERI-STRIP NONHPOA (GAUZE/BANDAGES/DRESSINGS) ×2
BENZOIN TINCTURE PRP APPL 2/3 (GAUZE/BANDAGES/DRESSINGS) ×3 IMPLANT
BLADE SURG 15 STRL LF DISP TIS (BLADE) ×2 IMPLANT
BLADE SURG 15 STRL SS (BLADE) ×3
BNDG GAUZE ELAST 4 BULKY (GAUZE/BANDAGES/DRESSINGS) ×3 IMPLANT
BRIEF STRETCH FOR OB PAD LRG (UNDERPADS AND DIAPERS) IMPLANT
CHLORAPREP W/TINT 26 (MISCELLANEOUS) IMPLANT
COVER BACK TABLE 60X90IN (DRAPES) ×3 IMPLANT
COVER MAYO STAND STRL (DRAPES) ×3 IMPLANT
COVER WAND RF STERILE (DRAPES) ×3 IMPLANT
DERMABOND ADVANCED (GAUZE/BANDAGES/DRESSINGS)
DERMABOND ADVANCED .7 DNX12 (GAUZE/BANDAGES/DRESSINGS) IMPLANT
DRAPE LAPAROTOMY 100X72 PEDS (DRAPES) IMPLANT
DRAPE LAPAROTOMY TRNSV 102X78 (DRAPES) IMPLANT
DRAPE UTILITY XL STRL (DRAPES) ×3 IMPLANT
DRSG PAD ABDOMINAL 8X10 ST (GAUZE/BANDAGES/DRESSINGS) IMPLANT
ELECT REM PT RETURN 9FT ADLT (ELECTROSURGICAL) ×3
ELECTRODE REM PT RTRN 9FT ADLT (ELECTROSURGICAL) ×2 IMPLANT
GAUZE SPONGE 4X4 12PLY STRL (GAUZE/BANDAGES/DRESSINGS) ×3 IMPLANT
GAUZE SPONGE 4X4 12PLY STRL LF (GAUZE/BANDAGES/DRESSINGS) ×3 IMPLANT
GLOVE SURG ENC MOIS LTX SZ6 (GLOVE) ×3 IMPLANT
GLOVE SURG UNDER LTX SZ6.5 (GLOVE) ×3 IMPLANT
GOWN STRL REUS W/TWL LRG LVL3 (GOWN DISPOSABLE) ×3 IMPLANT
KIT SIGMOIDOSCOPE (SET/KITS/TRAYS/PACK) IMPLANT
KIT TURNOVER CYSTO (KITS) ×3 IMPLANT
NEEDLE HYPO 22GX1.5 SAFETY (NEEDLE) IMPLANT
NEEDLE HYPO 25X1 1.5 SAFETY (NEEDLE) IMPLANT
PACK BASIN DAY SURGERY FS (CUSTOM PROCEDURE TRAY) ×3 IMPLANT
PENCIL SMOKE EVACUATOR (MISCELLANEOUS) ×3 IMPLANT
SPONGE GAUZE 2X2 8PLY STRL LF (GAUZE/BANDAGES/DRESSINGS) ×3 IMPLANT
SPONGE LAP 18X18 RF (DISPOSABLE) IMPLANT
SPONGE LAP 4X18 RFD (DISPOSABLE) IMPLANT
STAPLER VISISTAT 35W (STAPLE) IMPLANT
STRIP CLOSURE SKIN 1/2X4 (GAUZE/BANDAGES/DRESSINGS) ×3 IMPLANT
SUCTION FRAZIER HANDLE 10FR (MISCELLANEOUS)
SUCTION TUBE FRAZIER 10FR DISP (MISCELLANEOUS) IMPLANT
SUT CHROMIC 3 0 SH 27 (SUTURE) IMPLANT
SUT MNCRL AB 4-0 PS2 18 (SUTURE) ×6 IMPLANT
SUT VIC AB 2-0 SH 27 (SUTURE)
SUT VIC AB 2-0 SH 27XBRD (SUTURE) IMPLANT
SUT VIC AB 3-0 SH 18 (SUTURE) ×3 IMPLANT
SUT VIC AB 3-0 SH 27 (SUTURE) ×3
SUT VIC AB 3-0 SH 27X BRD (SUTURE) ×2 IMPLANT
SYR BULB IRRIG 60ML STRL (SYRINGE) ×3 IMPLANT
SYR CONTROL 10ML LL (SYRINGE) ×3 IMPLANT
TOWEL OR 17X26 10 PK STRL BLUE (TOWEL DISPOSABLE) ×3 IMPLANT
TRAY DSU PREP LF (CUSTOM PROCEDURE TRAY) IMPLANT
TUBE CONNECTING 12X1/4 (SUCTIONS) ×3 IMPLANT
YANKAUER SUCT BULB TIP NO VENT (SUCTIONS) ×3 IMPLANT

## 2021-01-03 NOTE — Op Note (Signed)
Operative Note  Joanna Johnston  [id number]  [id number]  01/03/2021   Surgeon: Clovis Riley MD   Assistant: Carlena Hurl A-C   Procedure performed: 1. Excision of mid-back intramuscular soft tissue mass 3x3x3cm. 2. Excision of subcutaneous mass right forearm, 1.5x1x1cm.   Preop diagnosis: subcutaneous mass mid back and right forearm Post-op diagnosis/intraop findings: Soft tissue mass in the mid back just medial to the inferior border of the right scapula, intramuscular with measurements as above.  Soft tissue mass and subcutaneous tissue of the right forearm.   Specimens: Mid back mass and right forearm mass Retained items: No EBL: Minimal cc Complications: none   Description of procedure: After obtaining informed consent the patient was taken to the operating room and placed in the left lateral decubitus position on the operating room table where monitored anesthesia care was initiated, preoperative antibiotics were administered, SCDs applied, and a formal timeout was performed.  The skin surrounding the 2 previously marked masses was prepped and draped in usual sterile fashion.  After infiltration with local, a transverse incision was made over the lesion in the mid back and the soft tissues dissected bluntly and with cautery until the surface of the mass was encountered.  This was noted to be deep to the fascia and partially intramuscular on the medial aspect.  The mass was freed from its attachments to the surrounding soft tissue and handed off for pathology.  Hemostasis was ensured within the wound.  The deeper tissues were reapproximated with interrupted 3-0 Vicryls, following which the skin was closed with interrupted deep dermal 3-0 Vicryls and running subcuticular 4-0 Monocryl.  We then turned to the mass on the right forearm.  After infiltration with local, a transverse incision was made and cautery dissection ensued until the surface of the mass was encountered.  The mass was  delivered into the field and a small pedicle divided with cautery.  This mass also was sent for pathological examination.  Hemostasis was ensured within the wound and this was then closed with a deep dermal 3-0 Vicryl followed by subcuticular 4 Monocryl.  Benzoin, Steri-Strips and sterile dressings were then applied to both incisions.  The patient was then awakened, returned to the supine position and taken to PACU in stable condition.    All counts were correct at the completion of the case.

## 2021-01-03 NOTE — Transfer of Care (Signed)
Immediate Anesthesia Transfer of Care Note  Patient: Joanna Johnston  Procedure(s) Performed: EXCISION BACK LIPOMA (Back) EXCISION RIGHT ARM MASS (Right: Arm Lower)  Patient Location: PACU  Anesthesia Type:MAC  Level of Consciousness: awake, alert , oriented and patient cooperative  Airway & Oxygen Therapy: Patient Spontanous Breathing  Post-op Assessment: Report given to RN, Post -op Vital signs reviewed and stable and Patient moving all extremities X 4  Post vital signs: Reviewed and stable  Last Vitals:  Vitals Value Taken Time  BP    Temp    Pulse    Resp    SpO2      Last Pain:  Vitals:   01/03/21 0822  TempSrc: Oral  PainSc: 0-No pain      Patients Stated Pain Goal: 7 (75/30/05 1102)  Complications: No notable events documented.

## 2021-01-03 NOTE — Interval H&P Note (Signed)
History and Physical Interval Note:  01/03/2021 9:50 AM  Joanna Johnston  has presented today for surgery, with the diagnosis of SUBCUTANEOUS MASSES.  The various methods of treatment have been discussed with the patient and family. After consideration of risks, benefits and other options for treatment, the patient has consented to  Procedure(s): EXCISION BACK LIPOMA (N/A) EXCISION RIGHT ARM MASS (Right) as a surgical intervention.  The patient's history has been reviewed, patient examined, no change in status, stable for surgery.  I have reviewed the patient's chart and labs.  Questions were answered to the patient's satisfaction.     Karem Farha Rich Brave

## 2021-01-03 NOTE — Anesthesia Preprocedure Evaluation (Signed)
Anesthesia Evaluation  Patient identified by MRN, date of birth, ID band Patient awake    Reviewed: NPO status , Patient's Chart, lab work & pertinent test results  Airway Mallampati: II  TM Distance: >3 FB Neck ROM: Full    Dental  (+) Partial Upper   Pulmonary neg pulmonary ROS,    Pulmonary exam normal        Cardiovascular negative cardio ROS   Rhythm:Regular Rate:Normal     Neuro/Psych negative neurological ROS  negative psych ROS   GI/Hepatic Neg liver ROS, GERD  Medicated,  Endo/Other  Hypothyroidism   Renal/GU Renal diseaseHistory of stones  negative genitourinary   Musculoskeletal Excision lipomas    Abdominal (+)  Abdomen: soft. Bowel sounds: normal.  Peds  Hematology negative hematology ROS (+)   Anesthesia Other Findings   Reproductive/Obstetrics                            Anesthesia Physical Anesthesia Plan  ASA: 2  Anesthesia Plan: MAC   Post-op Pain Management:    Induction: Intravenous  PONV Risk Score and Plan: 2 and Ondansetron, Dexamethasone, Propofol infusion, Midazolam and Treatment may vary due to age or medical condition  Airway Management Planned: Simple Face Mask, Natural Airway and Nasal Cannula  Additional Equipment: None  Intra-op Plan:   Post-operative Plan:   Informed Consent: I have reviewed the patients History and Physical, chart, labs and discussed the procedure including the risks, benefits and alternatives for the proposed anesthesia with the patient or authorized representative who has indicated his/her understanding and acceptance.     Dental advisory given  Plan Discussed with: CRNA  Anesthesia Plan Comments:         Anesthesia Quick Evaluation

## 2021-01-03 NOTE — Discharge Instructions (Addendum)
GENERAL SURGERY: POST OP INSTRUCTIONS  EAT Gradually transition to a high fiber diet with a fiber supplement over the next few weeks after discharge.  Start with a pureed / full liquid diet (see below)  WALK Walk an hour a day (cumulative, not all at once).  Control your pain to do that.    CONTROL PAIN Control pain so that you can walk, sleep, tolerate sneezing/coughing, go up/down stairs.  HAVE A BOWEL MOVEMENT DAILY Keep your bowels regular to avoid problems.  OK to try a laxative to override constipation.  OK to use an antidairrheal to slow down diarrhea.  Call if not better after 2 tries  CALL IF YOU HAVE PROBLEMS/CONCERNS Call if you are still struggling despite following these instructions. Call if you have concerns not answered by these instructions    DIET: Follow a light bland diet & liquids the first 24 hours after arrival home, such as soup, liquids, starches, etc.  Be sure to drink plenty of fluids.  Quickly advance to a usual solid diet within a few days.  Avoid fast food or heavy meals as your are more likely to get nauseated or have irregular bowels.  A low-sugar, high-fiber diet for the rest of your life is ideal.   Take your usually prescribed home medications unless otherwise directed. PAIN CONTROL: Pain is best controlled by a usual combination of three different methods TOGETHER: Ice/Heat Over the counter pain medication Prescription pain medication Most patients will experience some swelling and bruising around the incisions.  Ice packs or heating pads (30-60 minutes up to 6 times a day) will help. Use ice for the first few days to help decrease swelling and bruising, then switch to heat to help relax tight/sore spots and speed recovery.  Some people prefer to use ice alone, heat alone, alternating between ice & heat.  Experiment to what works for you.  Swelling and bruising can take several weeks to resolve.   It is helpful to take an over-the-counter pain  medication regularly for the first few weeks.  Choose one of the following that works best for you: Naproxen (Aleve, etc)  Two 220mg  tabs twice a day OR Ibuprofen (Advil, etc) Three 200mg  tabs four times a day (every meal & bedtime) AND Acetaminophen (Tylenol, etc) 500-650mg  four times a day (every meal & bedtime) A  prescription for pain medication (such as oxycodone, hydrocodone, etc) should be given to you upon discharge.  Take your pain medication as prescribed.  If you are having problems/concerns with the prescription medicine (does not control pain, nausea, vomiting, rash, itching, etc), please call us [phone] to see if we need to switch you to a different pain medicine that will work better for you and/or control your side effect better. If you need a refill on your pain medication, please contact your pharmacy.  They will contact our office to request authorization. Prescriptions will not be filled after 5 pm or on week-ends. Avoid getting constipated.  Between the surgery and the pain medications, it is common to experience some constipation.  Increasing fluid intake and taking a fiber supplement (such as Metamucil, Citrucel, FiberCon, MiraLax, etc) 1-2 times a day regularly will usually help prevent this problem from occurring.  A mild laxative (prune juice, Milk of Magnesia, MiraLax, etc) should be taken according to package directions if there are no bowel movements after 48 hours.   Wash / shower every day, starting 2 days after surgery.  You may shower over the  steri strips as they are waterproof.  Continue to shower over incision(s) after the dressing is off. Remove your outer bandage 2 days after surgery; steri strips will peel off after 1-2 weeks.  You may leave the incision open to air.  You may have skin tapes (Steri Strips) covering the incision(s).  Leave them on until one week, then remove.  You may replace a dressing/Band-Aid to cover the incision for comfort if you wish.     ACTIVITIES as tolerated:   You may resume regular (light) daily activities beginning the next day--such as daily self-care, walking, climbing stairs--gradually increasing activities as tolerated.  If you can walk 30 minutes without difficulty, it is safe to try more intense activity such as jogging, treadmill, bicycling, low-impact aerobics, swimming, etc. Save the most intensive and strenuous activity for last such as sit-ups, heavy lifting, contact sports, etc  Refrain from any heavy lifting or straining until you are off narcotics for pain control.   DO NOT PUSH THROUGH PAIN.  Let pain be your guide: If it hurts to do something, don't do it.  Pain is your body warning you to avoid that activity for another week until the pain goes down. You may drive when you are no longer taking prescription pain medication, you can comfortably wear a seatbelt, and you can safely maneuver your car and apply brakes. You may have sexual intercourse when it is comfortable.  FOLLOW UP in our office Please call [facility] at [phone] to set up an appointment to see your surgeon in the office for a follow-up appointment approximately 2-3 weeks after your surgery. Make sure that you call for this appointment the day you arrive home to insure a convenient appointment time. 9. IF YOU HAVE DISABILITY OR FAMILY LEAVE FORMS, BRING THEM TO THE OFFICE FOR PROCESSING.  DO NOT GIVE THEM TO YOUR DOCTOR.   WHEN TO CALL us [phone]: Poor pain control Reactions / problems with new medications (rash/itching, nausea, etc)  Fever over 101.5 F (38.5 C) Worsening swelling or bruising Continued bleeding from incision. Increased pain, redness, or drainage from the incision Difficulty breathing / swallowing   The clinic staff is available to answer your questions during regular business hours (8:30am-5pm).  Please don't hesitate to call and ask to speak to one of our nurses for clinical concerns.   If you have a medical  emergency, go to the nearest emergency room or call 911.  A surgeon from [facility] is always on call at the [facility], [address], [place], [place], [place]  [id number] ? MAIN: [phone] ? TOLL FREE: [phone] ?  FAX [phone] [email or web address]   Post Anesthesia Home Care Instructions  Activity: Get plenty of rest for the remainder of the day. A responsible individual must stay with you for 24 hours following the procedure.  For the next 24 hours, DO NOT: -Drive a car -Paediatric nurse -Drink alcoholic beverages -Take any medication unless instructed by your physician -Make any legal decisions or sign important papers.  Meals: Start with liquid foods such as gelatin or soup. Progress to regular foods as tolerated. Avoid greasy, spicy, heavy foods. If nausea and/or vomiting occur, drink only clear liquids until the nausea and/or vomiting subsides. Call your physician if vomiting continues.  Special Instructions/Symptoms: Your throat may feel dry or sore from the anesthesia or the breathing tube placed in your throat during surgery. If this causes discomfort, gargle with  warm salt water. The discomfort should disappear within 24 hours.  **No Tylenol or acetaminophen until 2pm today if needed.

## 2021-01-04 LAB — SURGICAL PATHOLOGY

## 2021-01-04 NOTE — Anesthesia Postprocedure Evaluation (Signed)
Anesthesia Post Note  Patient: Joanna Johnston  Procedure(s) Performed: EXCISION BACK LIPOMA (Back) EXCISION RIGHT ARM MASS (Right: Arm Lower)     Patient location during evaluation: PACU Anesthesia Type: MAC Level of consciousness: awake and alert Pain management: pain level controlled Vital Signs Assessment: post-procedure vital signs reviewed and stable Respiratory status: spontaneous breathing, nonlabored ventilation, respiratory function stable and patient connected to nasal cannula oxygen Cardiovascular status: stable and blood pressure returned to baseline Postop Assessment: no apparent nausea or vomiting Anesthetic complications: no   No notable events documented.  Last Vitals:  Vitals:   01/03/21 1210 01/03/21 1240  BP: (!) 130/56 (!) 146/78  Pulse: 64 65  Resp: 15 18  Temp: (!) 36.3 C (!) 36.4 C  SpO2: 100% 99%    Last Pain:  Vitals:   01/03/21 1240  TempSrc:   PainSc: 0-No pain                 Belenda Cruise P Dylan Ruotolo

## 2021-01-05 ENCOUNTER — Encounter (HOSPITAL_BASED_OUTPATIENT_CLINIC_OR_DEPARTMENT_OTHER): Payer: Self-pay | Admitting: Surgery

## 2021-01-12 DIAGNOSIS — Z23 Encounter for immunization: Secondary | ICD-10-CM | POA: Diagnosis not present

## 2021-03-07 ENCOUNTER — Other Ambulatory Visit: Payer: Self-pay | Admitting: Internal Medicine

## 2021-03-07 DIAGNOSIS — R911 Solitary pulmonary nodule: Secondary | ICD-10-CM

## 2021-03-12 DIAGNOSIS — Z800 Family history of malignant neoplasm of digestive organs: Secondary | ICD-10-CM | POA: Diagnosis not present

## 2021-03-12 DIAGNOSIS — K219 Gastro-esophageal reflux disease without esophagitis: Secondary | ICD-10-CM | POA: Diagnosis not present

## 2021-03-12 DIAGNOSIS — Z86010 Personal history of colonic polyps: Secondary | ICD-10-CM | POA: Diagnosis not present

## 2021-03-30 DIAGNOSIS — Z800 Family history of malignant neoplasm of digestive organs: Secondary | ICD-10-CM | POA: Diagnosis not present

## 2021-03-30 DIAGNOSIS — Z86010 Personal history of colonic polyps: Secondary | ICD-10-CM | POA: Diagnosis not present

## 2021-04-03 DIAGNOSIS — L980 Pyogenic granuloma: Secondary | ICD-10-CM | POA: Diagnosis not present

## 2021-04-05 ENCOUNTER — Ambulatory Visit
Admission: RE | Admit: 2021-04-05 | Discharge: 2021-04-05 | Disposition: A | Discharge status: Home / Self Care | Payer: Medicare Other | Source: Ambulatory Visit | Attending: Internal Medicine | Admitting: Internal Medicine

## 2021-04-05 ENCOUNTER — Inpatient Hospital Stay: Admission: RE | Admit: 2021-04-05 | Payer: Medicare Other | Source: Ambulatory Visit

## 2021-04-05 DIAGNOSIS — R911 Solitary pulmonary nodule: Secondary | ICD-10-CM

## 2021-04-05 DIAGNOSIS — J9811 Atelectasis: Secondary | ICD-10-CM | POA: Diagnosis not present

## 2021-04-18 DIAGNOSIS — Z23 Encounter for immunization: Secondary | ICD-10-CM | POA: Diagnosis not present

## 2021-04-20 DIAGNOSIS — Z23 Encounter for immunization: Secondary | ICD-10-CM | POA: Diagnosis not present

## 2021-05-14 DIAGNOSIS — N6002 Solitary cyst of left breast: Secondary | ICD-10-CM | POA: Diagnosis not present

## 2021-05-14 DIAGNOSIS — R928 Other abnormal and inconclusive findings on diagnostic imaging of breast: Secondary | ICD-10-CM | POA: Diagnosis not present

## 2021-06-04 DIAGNOSIS — E782 Mixed hyperlipidemia: Secondary | ICD-10-CM | POA: Diagnosis not present

## 2021-06-04 DIAGNOSIS — E039 Hypothyroidism, unspecified: Secondary | ICD-10-CM | POA: Diagnosis not present

## 2021-06-04 DIAGNOSIS — R7301 Impaired fasting glucose: Secondary | ICD-10-CM | POA: Diagnosis not present

## 2021-06-04 DIAGNOSIS — I10 Essential (primary) hypertension: Secondary | ICD-10-CM | POA: Diagnosis not present

## 2021-06-11 DIAGNOSIS — M47812 Spondylosis without myelopathy or radiculopathy, cervical region: Secondary | ICD-10-CM | POA: Diagnosis not present

## 2021-06-11 DIAGNOSIS — E782 Mixed hyperlipidemia: Secondary | ICD-10-CM | POA: Diagnosis not present

## 2021-06-11 DIAGNOSIS — K219 Gastro-esophageal reflux disease without esophagitis: Secondary | ICD-10-CM | POA: Diagnosis not present

## 2021-06-11 DIAGNOSIS — Z1339 Encounter for screening examination for other mental health and behavioral disorders: Secondary | ICD-10-CM | POA: Diagnosis not present

## 2021-06-11 DIAGNOSIS — I129 Hypertensive chronic kidney disease with stage 1 through stage 4 chronic kidney disease, or unspecified chronic kidney disease: Secondary | ICD-10-CM | POA: Diagnosis not present

## 2021-06-11 DIAGNOSIS — R82998 Other abnormal findings in urine: Secondary | ICD-10-CM | POA: Diagnosis not present

## 2021-06-11 DIAGNOSIS — Z0000 Encounter for general adult medical examination without abnormal findings: Secondary | ICD-10-CM | POA: Diagnosis not present

## 2021-06-11 DIAGNOSIS — E039 Hypothyroidism, unspecified: Secondary | ICD-10-CM | POA: Diagnosis not present

## 2021-06-11 DIAGNOSIS — Z1331 Encounter for screening for depression: Secondary | ICD-10-CM | POA: Diagnosis not present

## 2021-06-11 DIAGNOSIS — E669 Obesity, unspecified: Secondary | ICD-10-CM | POA: Diagnosis not present

## 2021-06-11 DIAGNOSIS — I10 Essential (primary) hypertension: Secondary | ICD-10-CM | POA: Diagnosis not present

## 2021-07-17 DIAGNOSIS — Z20822 Contact with and (suspected) exposure to covid-19: Secondary | ICD-10-CM | POA: Diagnosis not present

## 2021-09-12 DIAGNOSIS — N3020 Other chronic cystitis without hematuria: Secondary | ICD-10-CM | POA: Diagnosis not present

## 2021-09-12 DIAGNOSIS — N200 Calculus of kidney: Secondary | ICD-10-CM | POA: Diagnosis not present

## 2021-10-08 DIAGNOSIS — H2513 Age-related nuclear cataract, bilateral: Secondary | ICD-10-CM | POA: Diagnosis not present

## 2021-12-12 DIAGNOSIS — K219 Gastro-esophageal reflux disease without esophagitis: Secondary | ICD-10-CM | POA: Diagnosis not present

## 2021-12-12 DIAGNOSIS — R7301 Impaired fasting glucose: Secondary | ICD-10-CM | POA: Diagnosis not present

## 2021-12-12 DIAGNOSIS — E782 Mixed hyperlipidemia: Secondary | ICD-10-CM | POA: Diagnosis not present

## 2021-12-12 DIAGNOSIS — E669 Obesity, unspecified: Secondary | ICD-10-CM | POA: Diagnosis not present

## 2021-12-12 DIAGNOSIS — I129 Hypertensive chronic kidney disease with stage 1 through stage 4 chronic kidney disease, or unspecified chronic kidney disease: Secondary | ICD-10-CM | POA: Diagnosis not present

## 2021-12-12 DIAGNOSIS — E039 Hypothyroidism, unspecified: Secondary | ICD-10-CM | POA: Diagnosis not present

## 2021-12-12 DIAGNOSIS — M47812 Spondylosis without myelopathy or radiculopathy, cervical region: Secondary | ICD-10-CM | POA: Diagnosis not present

## 2022-01-23 IMAGING — CT CT CHEST W/O CM
2 of 4 series · 11 of 36 positions shown, 13 images · non-contrast
Comparison: September 2020 CT abdomen

CLINICAL DATA: Lung nodule follow-up

EXAM:
CT CHEST WITHOUT CONTRAST
TECHNIQUE: Multidetector CT imaging of the chest was performed following the
standard protocol without IV contrast.

[Series 2: chest 2.00 br40 s3 · axial · 0.53mm/px · z∈[+1592,+1834]mm · 8 of 145 slices shown, 10 images (1 of 2)]
[im 12/145  mediastinal]
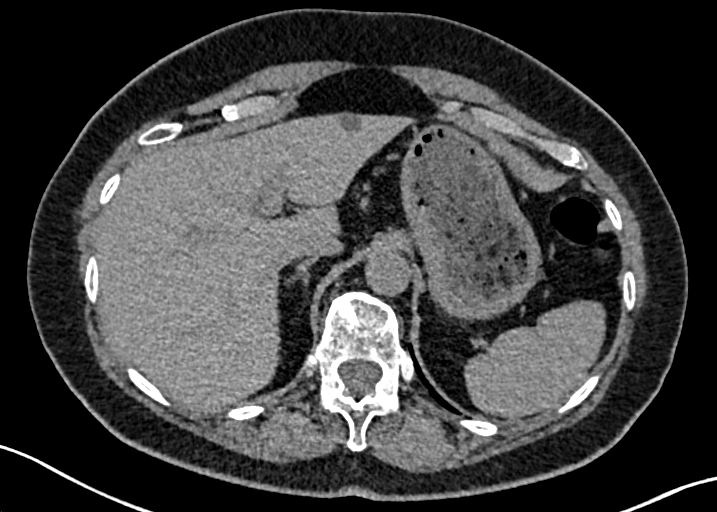
[im 12/145  lung]
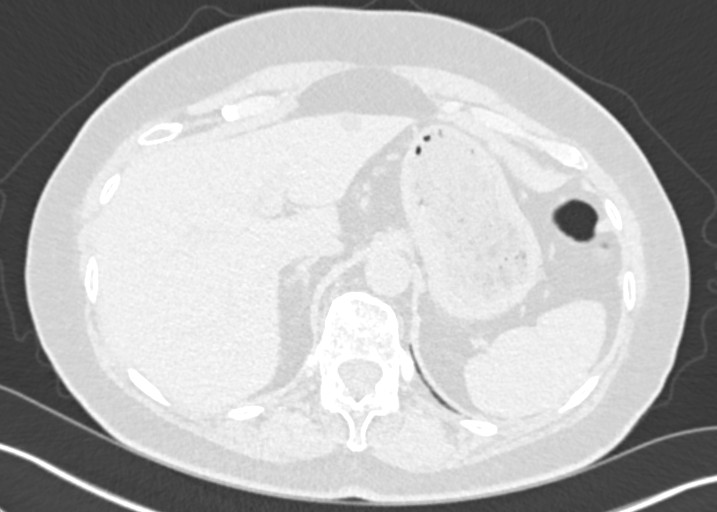
[im 34/145  lung]
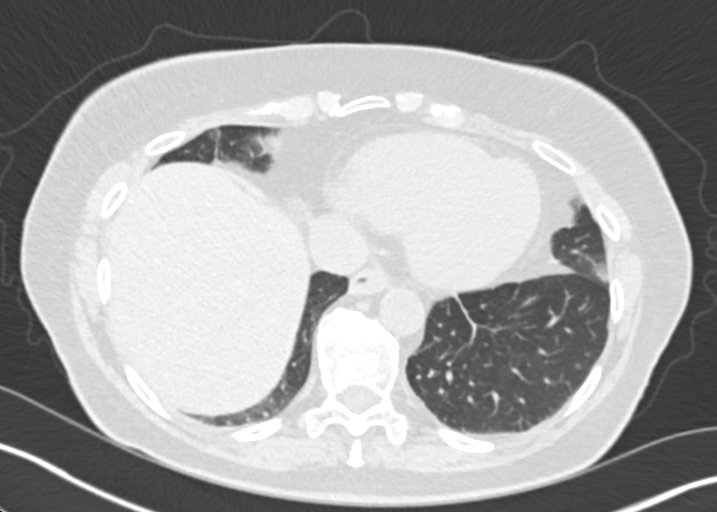
[im 45/145  lung]
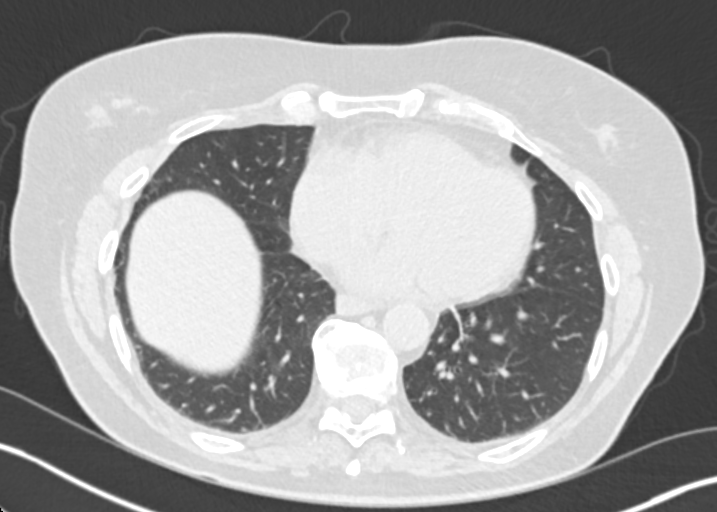
[im 67/145  lung]
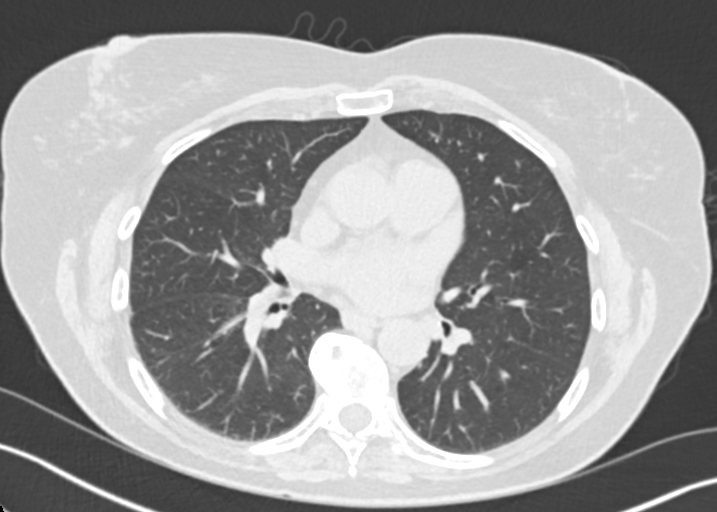
[im 78/145  mediastinal]
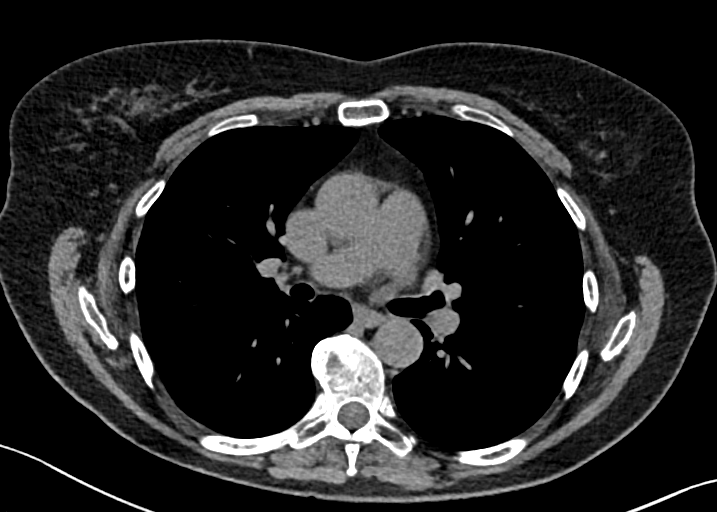
[im 78/145  lung]
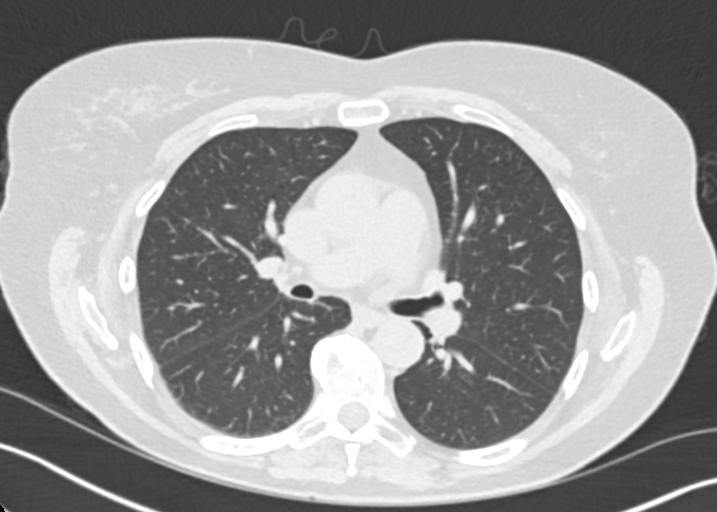
[im 100/145  lung]
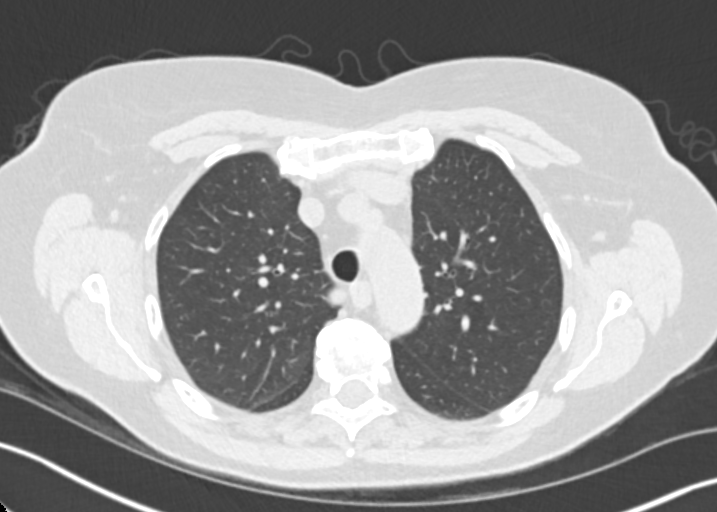
[im 111/145  lung]
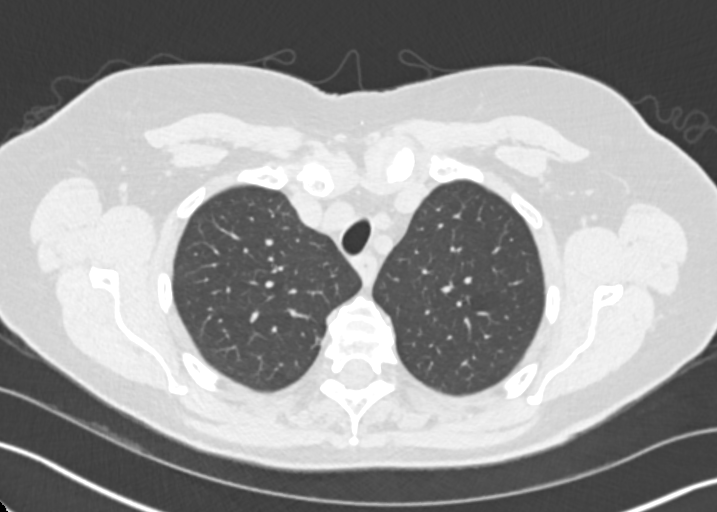
[im 133/145  lung]
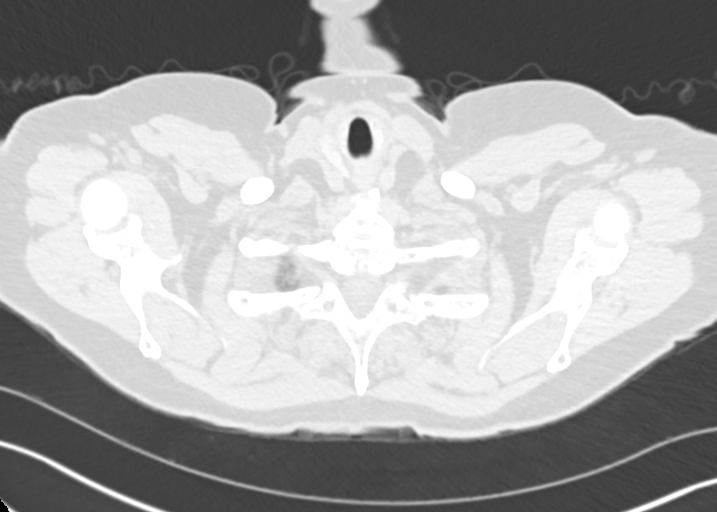

[Series 4: chest 2.00 br40 s3 · coronal · 0.57mm/px · 3 of 135 slices shown (2 of 2)]
[im 27/135  lung]
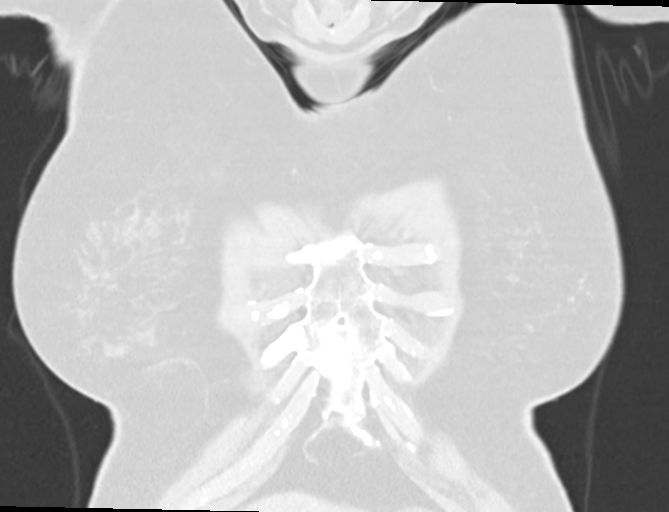
[im 54/135  lung]
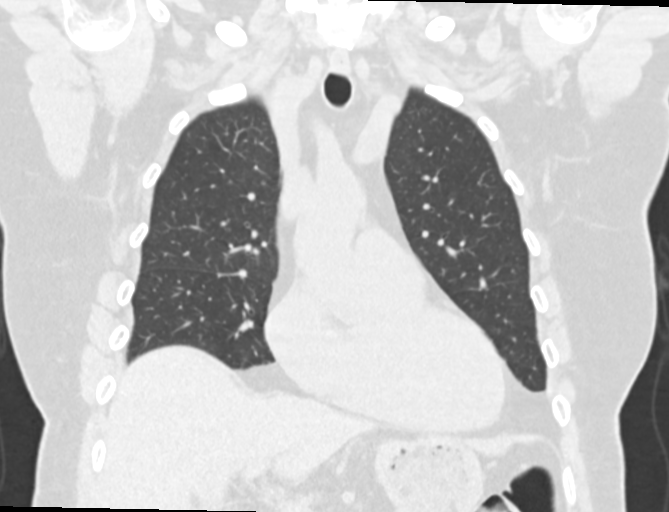
[im 81/135  lung]
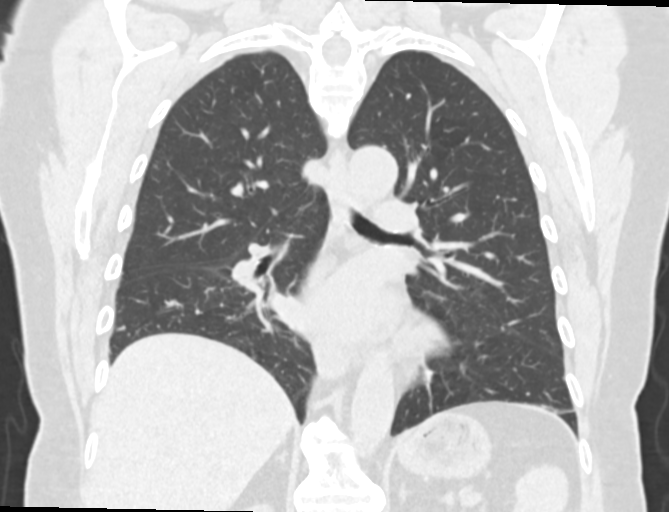

[11 of 36 positions shown; findings below may reference images not displayed]

FINDINGS: Cardiovascular: Normal heart size. No pericardial effusion. Mild
calcified plaque along the aortic arch.

Mediastinum/Nodes: No enlarged lymph nodes. Left thyroidectomy.
Small hiatal hernia.

Lungs/Pleura: There is small area of atelectasis/scarring at the
left lung base at the site of nodular opacity seen on prior study.
Additional areas of scarring/atelectasis at the lung bases. No new
nodule. No pleural effusion.

Upper Abdomen: No acute abnormality.

Musculoskeletal: No acute osseous abnormality.
IMPRESSION: Nodular opacity on the prior study reflected rounded atelectasis.

## 2022-02-27 DIAGNOSIS — R110 Nausea: Secondary | ICD-10-CM | POA: Diagnosis not present

## 2022-02-28 DIAGNOSIS — R110 Nausea: Secondary | ICD-10-CM | POA: Diagnosis not present

## 2022-03-06 DIAGNOSIS — R6881 Early satiety: Secondary | ICD-10-CM | POA: Diagnosis not present

## 2022-03-06 DIAGNOSIS — R110 Nausea: Secondary | ICD-10-CM | POA: Diagnosis not present

## 2022-03-07 DIAGNOSIS — R110 Nausea: Secondary | ICD-10-CM | POA: Diagnosis not present

## 2022-03-07 DIAGNOSIS — K3189 Other diseases of stomach and duodenum: Secondary | ICD-10-CM | POA: Diagnosis not present

## 2022-03-07 DIAGNOSIS — K449 Diaphragmatic hernia without obstruction or gangrene: Secondary | ICD-10-CM | POA: Diagnosis not present

## 2022-05-15 DIAGNOSIS — Z23 Encounter for immunization: Secondary | ICD-10-CM | POA: Diagnosis not present

## 2022-05-20 DIAGNOSIS — Z1231 Encounter for screening mammogram for malignant neoplasm of breast: Secondary | ICD-10-CM | POA: Diagnosis not present

## 2022-06-10 DIAGNOSIS — R110 Nausea: Secondary | ICD-10-CM | POA: Diagnosis not present

## 2022-06-10 DIAGNOSIS — R1319 Other dysphagia: Secondary | ICD-10-CM | POA: Diagnosis not present

## 2022-06-10 DIAGNOSIS — K449 Diaphragmatic hernia without obstruction or gangrene: Secondary | ICD-10-CM | POA: Diagnosis not present

## 2022-06-18 DIAGNOSIS — R7301 Impaired fasting glucose: Secondary | ICD-10-CM | POA: Diagnosis not present

## 2022-06-18 DIAGNOSIS — E782 Mixed hyperlipidemia: Secondary | ICD-10-CM | POA: Diagnosis not present

## 2022-06-18 DIAGNOSIS — E039 Hypothyroidism, unspecified: Secondary | ICD-10-CM | POA: Diagnosis not present

## 2022-06-18 DIAGNOSIS — I10 Essential (primary) hypertension: Secondary | ICD-10-CM | POA: Diagnosis not present

## 2022-06-24 DIAGNOSIS — E669 Obesity, unspecified: Secondary | ICD-10-CM | POA: Diagnosis not present

## 2022-06-24 DIAGNOSIS — N1831 Chronic kidney disease, stage 3a: Secondary | ICD-10-CM | POA: Diagnosis not present

## 2022-06-24 DIAGNOSIS — R82998 Other abnormal findings in urine: Secondary | ICD-10-CM | POA: Diagnosis not present

## 2022-06-24 DIAGNOSIS — I10 Essential (primary) hypertension: Secondary | ICD-10-CM | POA: Diagnosis not present

## 2022-06-24 DIAGNOSIS — E782 Mixed hyperlipidemia: Secondary | ICD-10-CM | POA: Diagnosis not present

## 2022-06-24 DIAGNOSIS — Z1339 Encounter for screening examination for other mental health and behavioral disorders: Secondary | ICD-10-CM | POA: Diagnosis not present

## 2022-06-24 DIAGNOSIS — I129 Hypertensive chronic kidney disease with stage 1 through stage 4 chronic kidney disease, or unspecified chronic kidney disease: Secondary | ICD-10-CM | POA: Diagnosis not present

## 2022-06-24 DIAGNOSIS — Z0000 Encounter for general adult medical examination without abnormal findings: Secondary | ICD-10-CM | POA: Diagnosis not present

## 2022-06-24 DIAGNOSIS — Z1331 Encounter for screening for depression: Secondary | ICD-10-CM | POA: Diagnosis not present

## 2022-06-24 DIAGNOSIS — E039 Hypothyroidism, unspecified: Secondary | ICD-10-CM | POA: Diagnosis not present

## 2022-07-31 DIAGNOSIS — R1030 Lower abdominal pain, unspecified: Secondary | ICD-10-CM | POA: Diagnosis not present

## 2022-07-31 DIAGNOSIS — Z6833 Body mass index (BMI) 33.0-33.9, adult: Secondary | ICD-10-CM | POA: Diagnosis not present

## 2022-07-31 DIAGNOSIS — M791 Myalgia, unspecified site: Secondary | ICD-10-CM | POA: Diagnosis not present

## 2022-08-06 DIAGNOSIS — N764 Abscess of vulva: Secondary | ICD-10-CM | POA: Diagnosis not present

## 2022-08-13 DIAGNOSIS — N764 Abscess of vulva: Secondary | ICD-10-CM | POA: Diagnosis not present

## 2022-09-10 DIAGNOSIS — R1032 Left lower quadrant pain: Secondary | ICD-10-CM | POA: Diagnosis not present

## 2022-09-10 DIAGNOSIS — N764 Abscess of vulva: Secondary | ICD-10-CM | POA: Diagnosis not present

## 2022-09-10 DIAGNOSIS — R102 Pelvic and perineal pain: Secondary | ICD-10-CM | POA: Diagnosis not present

## 2022-09-10 DIAGNOSIS — M25552 Pain in left hip: Secondary | ICD-10-CM | POA: Diagnosis not present

## 2022-09-16 DIAGNOSIS — R051 Acute cough: Secondary | ICD-10-CM | POA: Diagnosis not present

## 2022-09-16 DIAGNOSIS — U071 COVID-19: Secondary | ICD-10-CM | POA: Diagnosis not present

## 2022-09-18 DIAGNOSIS — N3020 Other chronic cystitis without hematuria: Secondary | ICD-10-CM | POA: Diagnosis not present

## 2022-09-18 DIAGNOSIS — N200 Calculus of kidney: Secondary | ICD-10-CM | POA: Diagnosis not present

## 2022-11-11 DIAGNOSIS — L814 Other melanin hyperpigmentation: Secondary | ICD-10-CM | POA: Diagnosis not present

## 2022-11-11 DIAGNOSIS — C44729 Squamous cell carcinoma of skin of left lower limb, including hip: Secondary | ICD-10-CM | POA: Diagnosis not present

## 2022-11-11 DIAGNOSIS — D492 Neoplasm of unspecified behavior of bone, soft tissue, and skin: Secondary | ICD-10-CM | POA: Diagnosis not present

## 2022-11-11 DIAGNOSIS — L570 Actinic keratosis: Secondary | ICD-10-CM | POA: Diagnosis not present

## 2022-12-16 DIAGNOSIS — R7301 Impaired fasting glucose: Secondary | ICD-10-CM | POA: Diagnosis not present

## 2022-12-16 DIAGNOSIS — E669 Obesity, unspecified: Secondary | ICD-10-CM | POA: Diagnosis not present

## 2022-12-16 DIAGNOSIS — N1831 Chronic kidney disease, stage 3a: Secondary | ICD-10-CM | POA: Diagnosis not present

## 2022-12-16 DIAGNOSIS — I129 Hypertensive chronic kidney disease with stage 1 through stage 4 chronic kidney disease, or unspecified chronic kidney disease: Secondary | ICD-10-CM | POA: Diagnosis not present

## 2022-12-31 DIAGNOSIS — R635 Abnormal weight gain: Secondary | ICD-10-CM | POA: Diagnosis not present

## 2022-12-31 DIAGNOSIS — Z6832 Body mass index (BMI) 32.0-32.9, adult: Secondary | ICD-10-CM | POA: Diagnosis not present

## 2022-12-31 DIAGNOSIS — N958 Other specified menopausal and perimenopausal disorders: Secondary | ICD-10-CM | POA: Diagnosis not present

## 2022-12-31 DIAGNOSIS — R7303 Prediabetes: Secondary | ICD-10-CM | POA: Diagnosis not present

## 2022-12-31 DIAGNOSIS — I10 Essential (primary) hypertension: Secondary | ICD-10-CM | POA: Diagnosis not present

## 2022-12-31 DIAGNOSIS — E538 Deficiency of other specified B group vitamins: Secondary | ICD-10-CM | POA: Diagnosis not present

## 2022-12-31 DIAGNOSIS — E039 Hypothyroidism, unspecified: Secondary | ICD-10-CM | POA: Diagnosis not present

## 2022-12-31 DIAGNOSIS — E88819 Insulin resistance, unspecified: Secondary | ICD-10-CM | POA: Diagnosis not present

## 2022-12-31 DIAGNOSIS — E785 Hyperlipidemia, unspecified: Secondary | ICD-10-CM | POA: Diagnosis not present

## 2022-12-31 DIAGNOSIS — N951 Menopausal and female climacteric states: Secondary | ICD-10-CM | POA: Diagnosis not present

## 2023-01-02 DIAGNOSIS — N951 Menopausal and female climacteric states: Secondary | ICD-10-CM | POA: Diagnosis not present

## 2023-01-02 DIAGNOSIS — E669 Obesity, unspecified: Secondary | ICD-10-CM | POA: Diagnosis not present

## 2023-01-02 DIAGNOSIS — E785 Hyperlipidemia, unspecified: Secondary | ICD-10-CM | POA: Diagnosis not present

## 2023-01-02 DIAGNOSIS — I10 Essential (primary) hypertension: Secondary | ICD-10-CM | POA: Diagnosis not present

## 2023-01-02 DIAGNOSIS — E039 Hypothyroidism, unspecified: Secondary | ICD-10-CM | POA: Diagnosis not present

## 2023-01-02 DIAGNOSIS — Z1331 Encounter for screening for depression: Secondary | ICD-10-CM | POA: Diagnosis not present

## 2023-01-02 DIAGNOSIS — M2550 Pain in unspecified joint: Secondary | ICD-10-CM | POA: Diagnosis not present

## 2023-01-02 DIAGNOSIS — R7303 Prediabetes: Secondary | ICD-10-CM | POA: Diagnosis not present

## 2023-01-02 DIAGNOSIS — Z1339 Encounter for screening examination for other mental health and behavioral disorders: Secondary | ICD-10-CM | POA: Diagnosis not present

## 2023-01-02 DIAGNOSIS — R635 Abnormal weight gain: Secondary | ICD-10-CM | POA: Diagnosis not present

## 2023-01-02 DIAGNOSIS — R232 Flushing: Secondary | ICD-10-CM | POA: Diagnosis not present

## 2023-01-16 DIAGNOSIS — Z6832 Body mass index (BMI) 32.0-32.9, adult: Secondary | ICD-10-CM | POA: Diagnosis not present

## 2023-01-16 DIAGNOSIS — I10 Essential (primary) hypertension: Secondary | ICD-10-CM | POA: Diagnosis not present

## 2023-02-11 DIAGNOSIS — R7303 Prediabetes: Secondary | ICD-10-CM | POA: Diagnosis not present

## 2023-02-11 DIAGNOSIS — Z6832 Body mass index (BMI) 32.0-32.9, adult: Secondary | ICD-10-CM | POA: Diagnosis not present

## 2023-03-04 DIAGNOSIS — Z6831 Body mass index (BMI) 31.0-31.9, adult: Secondary | ICD-10-CM | POA: Diagnosis not present

## 2023-03-04 DIAGNOSIS — E785 Hyperlipidemia, unspecified: Secondary | ICD-10-CM | POA: Diagnosis not present

## 2023-03-25 DIAGNOSIS — I10 Essential (primary) hypertension: Secondary | ICD-10-CM | POA: Diagnosis not present

## 2023-03-25 DIAGNOSIS — Z6831 Body mass index (BMI) 31.0-31.9, adult: Secondary | ICD-10-CM | POA: Diagnosis not present

## 2023-03-25 DIAGNOSIS — E039 Hypothyroidism, unspecified: Secondary | ICD-10-CM | POA: Diagnosis not present

## 2023-04-02 DIAGNOSIS — Z6830 Body mass index (BMI) 30.0-30.9, adult: Secondary | ICD-10-CM | POA: Diagnosis not present

## 2023-04-02 DIAGNOSIS — R7303 Prediabetes: Secondary | ICD-10-CM | POA: Diagnosis not present

## 2023-04-02 DIAGNOSIS — E039 Hypothyroidism, unspecified: Secondary | ICD-10-CM | POA: Diagnosis not present

## 2023-04-20 DIAGNOSIS — Z23 Encounter for immunization: Secondary | ICD-10-CM | POA: Diagnosis not present

## 2023-05-19 DIAGNOSIS — Z6830 Body mass index (BMI) 30.0-30.9, adult: Secondary | ICD-10-CM | POA: Diagnosis not present

## 2023-05-19 DIAGNOSIS — R7303 Prediabetes: Secondary | ICD-10-CM | POA: Diagnosis not present

## 2023-06-23 DIAGNOSIS — Z6828 Body mass index (BMI) 28.0-28.9, adult: Secondary | ICD-10-CM | POA: Diagnosis not present

## 2023-06-23 DIAGNOSIS — R7303 Prediabetes: Secondary | ICD-10-CM | POA: Diagnosis not present

## 2023-06-25 DIAGNOSIS — R7301 Impaired fasting glucose: Secondary | ICD-10-CM | POA: Diagnosis not present

## 2023-06-25 DIAGNOSIS — E039 Hypothyroidism, unspecified: Secondary | ICD-10-CM | POA: Diagnosis not present

## 2023-06-25 DIAGNOSIS — I129 Hypertensive chronic kidney disease with stage 1 through stage 4 chronic kidney disease, or unspecified chronic kidney disease: Secondary | ICD-10-CM | POA: Diagnosis not present

## 2023-06-25 DIAGNOSIS — N1831 Chronic kidney disease, stage 3a: Secondary | ICD-10-CM | POA: Diagnosis not present

## 2023-06-25 DIAGNOSIS — E782 Mixed hyperlipidemia: Secondary | ICD-10-CM | POA: Diagnosis not present

## 2023-06-26 DIAGNOSIS — Z1231 Encounter for screening mammogram for malignant neoplasm of breast: Secondary | ICD-10-CM | POA: Diagnosis not present

## 2023-06-30 DIAGNOSIS — H8112 Benign paroxysmal vertigo, left ear: Secondary | ICD-10-CM | POA: Diagnosis not present

## 2023-06-30 DIAGNOSIS — H6123 Impacted cerumen, bilateral: Secondary | ICD-10-CM | POA: Diagnosis not present

## 2023-07-02 DIAGNOSIS — E669 Obesity, unspecified: Secondary | ICD-10-CM | POA: Diagnosis not present

## 2023-07-02 DIAGNOSIS — N1831 Chronic kidney disease, stage 3a: Secondary | ICD-10-CM | POA: Diagnosis not present

## 2023-07-02 DIAGNOSIS — Z0000 Encounter for general adult medical examination without abnormal findings: Secondary | ICD-10-CM | POA: Diagnosis not present

## 2023-07-02 DIAGNOSIS — Z1339 Encounter for screening examination for other mental health and behavioral disorders: Secondary | ICD-10-CM | POA: Diagnosis not present

## 2023-07-02 DIAGNOSIS — E039 Hypothyroidism, unspecified: Secondary | ICD-10-CM | POA: Diagnosis not present

## 2023-07-02 DIAGNOSIS — K219 Gastro-esophageal reflux disease without esophagitis: Secondary | ICD-10-CM | POA: Diagnosis not present

## 2023-07-02 DIAGNOSIS — I129 Hypertensive chronic kidney disease with stage 1 through stage 4 chronic kidney disease, or unspecified chronic kidney disease: Secondary | ICD-10-CM | POA: Diagnosis not present

## 2023-07-02 DIAGNOSIS — Z1331 Encounter for screening for depression: Secondary | ICD-10-CM | POA: Diagnosis not present

## 2023-07-02 DIAGNOSIS — M5116 Intervertebral disc disorders with radiculopathy, lumbar region: Secondary | ICD-10-CM | POA: Diagnosis not present

## 2023-07-02 DIAGNOSIS — E782 Mixed hyperlipidemia: Secondary | ICD-10-CM | POA: Diagnosis not present

## 2023-07-02 DIAGNOSIS — Z23 Encounter for immunization: Secondary | ICD-10-CM | POA: Diagnosis not present

## 2023-07-02 DIAGNOSIS — I10 Essential (primary) hypertension: Secondary | ICD-10-CM | POA: Diagnosis not present

## 2023-07-07 DIAGNOSIS — Z6828 Body mass index (BMI) 28.0-28.9, adult: Secondary | ICD-10-CM | POA: Diagnosis not present

## 2023-07-07 DIAGNOSIS — E039 Hypothyroidism, unspecified: Secondary | ICD-10-CM | POA: Diagnosis not present
